# Patient Record
Sex: Female | Born: 1965 | Race: Black or African American | Hispanic: No | Marital: Single | Smoking: Current every day smoker
Health system: Southern US, Community
[De-identification: ages and names within clinical notes are randomized; demographics above are authoritative.]

## PROBLEM LIST (undated history)

## (undated) DIAGNOSIS — R7989 Other specified abnormal findings of blood chemistry: Secondary | ICD-10-CM

## (undated) DIAGNOSIS — N183 Chronic kidney disease, stage 3 unspecified: Secondary | ICD-10-CM

## (undated) DIAGNOSIS — N1330 Unspecified hydronephrosis: Secondary | ICD-10-CM

## (undated) DIAGNOSIS — D219 Benign neoplasm of connective and other soft tissue, unspecified: Secondary | ICD-10-CM

## (undated) DIAGNOSIS — I10 Essential (primary) hypertension: Secondary | ICD-10-CM

## (undated) HISTORY — DX: Essential (primary) hypertension: I10

## (undated) HISTORY — PX: TUBAL LIGATION: SHX77

## (undated) HISTORY — DX: Other specified abnormal findings of blood chemistry: R79.89

## (undated) HISTORY — DX: Unspecified hydronephrosis: N13.30

## (undated) HISTORY — DX: Benign neoplasm of connective and other soft tissue, unspecified: D21.9

---

## 2014-09-19 ENCOUNTER — Ambulatory Visit: Payer: Self-pay

## 2016-04-06 ENCOUNTER — Ambulatory Visit: Payer: Self-pay | Admitting: Family

## 2016-04-06 NOTE — Progress Notes (Signed)
Met with pt. Pt in acute hypertensive crisis with original BP of 184/102. We let her rest for 25mn to see if it would improve and it significantly worsened to 206/112. Pt is here for a physical. I checked her heart/lungs and she is in NAD at this time and CTA BL with RRR, No MRGC noted. Pt understanding that she needs urgent treatment and in agreement to go to the ED.   BP (!) 206/112 (BP Location: Left Arm, Patient Position: Sitting, Cuff Size: Normal)   Pulse 98   Temp 99.5 F (37.5 C) (Oral)   Ht 5' 4"  (1.626 m)   Wt 168 lb 9.6 oz (76.5 kg)   BMI 28.94 kg/m    WBenjamine Mola F[place]2/27/2018 3:26 PM

## 2016-08-23 ENCOUNTER — Ambulatory Visit (INDEPENDENT_AMBULATORY_CARE_PROVIDER_SITE_OTHER): Payer: Self-pay | Admitting: Physician Assistant

## 2016-08-23 ENCOUNTER — Encounter (INDEPENDENT_AMBULATORY_CARE_PROVIDER_SITE_OTHER): Payer: Self-pay | Admitting: Physician Assistant

## 2016-08-23 VITALS — BP 173/96 | HR 86 | Temp 98.9°F | Ht 63.0 in | Wt 164.2 lb

## 2016-08-23 DIAGNOSIS — L83 Acanthosis nigricans: Secondary | ICD-10-CM

## 2016-08-23 DIAGNOSIS — Z23 Encounter for immunization: Secondary | ICD-10-CM

## 2016-08-23 DIAGNOSIS — I10 Essential (primary) hypertension: Secondary | ICD-10-CM

## 2016-08-23 LAB — POCT GLYCOSYLATED HEMOGLOBIN (HGB A1C): Hemoglobin A1C: 5.9

## 2016-08-23 MED ORDER — LISINOPRIL-HYDROCHLOROTHIAZIDE 20-12.5 MG PO TABS: 1.0000 | ORAL_TABLET | Freq: Every day | ORAL | 3 refills | Status: DC

## 2016-08-23 MED ORDER — CLONIDINE HCL 0.1 MG PO TABS
0.2000 mg | ORAL_TABLET | Freq: Once | ORAL | Status: AC
  Administered 2016-08-23: 0.2 mg via ORAL

## 2016-08-23 NOTE — Progress Notes (Addendum)
Subjective:  Patient ID: Deborah Miles, female    DOB: 03-23-1965  Age: 51 y.o. MRN: [id number]  CC: HTN  HPI Deborah Miles is a 51 y.o. female with a PMH of HTN presents to establish care for her HTN. Has not taken medication for several months. Used to take Lisinopril-HCTZ with good effect to normotensive range. Has noted some abdominal bloating and "bowels not moving like they are supposed to". Takes miralax to improve bowel transit. No symptoms except mild left sided occipital headache that occurred two days ago and lasted approximately "35 minutes or more and then just faded on out". Does not endorse CP, palpitations, SOB, f/c/n/v, abdominal pain, rash, LE edema, or urinary sxs.      ROS Review of Systems  Constitutional: Negative for chills, fever and malaise/fatigue.  Eyes: Negative for blurred vision.  Respiratory: Negative for shortness of breath.   Cardiovascular: Negative for chest pain and palpitations.  Gastrointestinal: Negative for abdominal pain and nausea.  Genitourinary: Negative for dysuria and hematuria.  Musculoskeletal: Negative for joint pain and myalgias.  Skin: Negative for rash.  Neurological: Negative for tingling and headaches.  Psychiatric/Behavioral: Negative for depression. The patient is not nervous/anxious.     Objective:  BP (!) 201/126 (BP Location: Left Arm, Patient Position: Sitting, Cuff Size: Normal)   Pulse 86   Temp 98.9 F (37.2 C) (Oral)   Ht 5\' 3"  (1.6 m)   Wt 164 lb 3.2 oz (74.5 kg)   LMP 08/10/2016 (Approximate)   SpO2 99%   BMI 29.09 kg/m   BP/Weight 08/23/2016 03/30/2540  Systolic BP 706 237  Diastolic BP 628 315  Wt. (Lbs) 164.2 168.6  BMI 29.09 28.94      Physical Exam  Constitutional: She is oriented to person, place, and time.  Well developed, well nourished, NAD, polite  HENT:  Head: Normocephalic and atraumatic.  No oral thrush  Eyes: Conjunctivae are normal. No scleral icterus.  Neck: Normal range  of motion. Neck supple. Thyromegaly (mild assymetry of the thryoid R side > L side) present.  Cardiovascular: Normal rate, regular rhythm and normal heart sounds.   Pulmonary/Chest: Effort normal and breath sounds normal.  Abdominal: There is no tenderness.  Musculoskeletal: She exhibits no edema.  Neurological: She is alert and oriented to person, place, and time.  Skin: Skin is warm and dry. No rash noted. No erythema. No pallor.  Hyperpigmented skin on back of neck.  Psychiatric: She has a normal mood and affect. Her behavior is normal. Thought content normal.  Vitals reviewed.    Assessment & Plan:   1. Hypertension, unspecified type - Administered cloNIDine (CATAPRES) tablet 0.2 mg; Take 2 tablets (0.2 mg total) by mouth once. - CBC with Differential - Comprehensive metabolic panel - TSH - Begin lisinopril-hydrochlorothiazide (ZESTORETIC) 20-12.5 MG tablet; Take 1 tablet by mouth daily.  Dispense: 60 tablet; Refill: 3  2. Need for Tdap vaccination - Tdap vaccine greater than or equal to 7yo IM  3. Acanthosis nigricans - HgB A1c 5/9% in clinic today.   Meds ordered this encounter  Medications  . cloNIDine (CATAPRES) tablet 0.2 mg  . lisinopril-hydrochlorothiazide (ZESTORETIC) 20-12.5 MG tablet    Sig: Take 1 tablet by mouth daily.    Dispense:  60 tablet    Refill:  3    Order Specific Question:   Supervising Provider    Answer:   Tresa Garter [id number]    Follow-up: Return in about 2 weeks (around 09/06/2016) for HTN.  [name] PA

## 2016-08-23 NOTE — Patient Instructions (Signed)

## 2016-08-24 LAB — CBC WITH DIFFERENTIAL/PLATELET
BASOS ABS: 0.1 10*3/uL (ref 0.0–0.2)
Basos: 1 %
EOS (ABSOLUTE): 0.4 10*3/uL (ref 0.0–0.4)
Eos: 3 %
Hematocrit: 29.5 % — ABNORMAL LOW (ref 34.0–46.6)
Hemoglobin: 8.8 g/dL — ABNORMAL LOW (ref 11.1–15.9)
IMMATURE GRANULOCYTES: 0 %
Immature Grans (Abs): 0 10*3/uL (ref 0.0–0.1)
LYMPHS ABS: 3.2 10*3/uL — AB (ref 0.7–3.1)
Lymphs: 25 %
MCH: 19.2 pg — AB (ref 26.6–33.0)
MCHC: 29.8 g/dL — AB (ref 31.5–35.7)
MCV: 64 fL — ABNORMAL LOW (ref 79–97)
Monocytes Absolute: 0.8 10*3/uL (ref 0.1–0.9)
Monocytes: 6 %
Neutrophils Absolute: 8.5 10*3/uL — ABNORMAL HIGH (ref 1.4–7.0)
Neutrophils: 65 %
PLATELETS: 430 10*3/uL — AB (ref 150–379)
RBC: 4.58 x10E6/uL (ref 3.77–5.28)
RDW: 23.6 % — AB (ref 12.3–15.4)
WBC: 13 10*3/uL — AB (ref 3.4–10.8)

## 2016-08-24 LAB — COMPREHENSIVE METABOLIC PANEL
ALK PHOS: 126 IU/L — AB (ref 39–117)
ALT: 12 IU/L (ref 0–32)
AST: 15 IU/L (ref 0–40)
Albumin/Globulin Ratio: 1.5 (ref 1.2–2.2)
Albumin: 4.5 g/dL (ref 3.5–5.5)
BUN / CREAT RATIO: 15 (ref 9–23)
BUN: 28 mg/dL — AB (ref 6–24)
CHLORIDE: 93 mmol/L — AB (ref 96–106)
CO2: 21 mmol/L (ref 20–29)
Calcium: 9.4 mg/dL (ref 8.7–10.2)
Creatinine, Ser: 1.9 mg/dL — ABNORMAL HIGH (ref 0.57–1.00)
GFR calc Af Amer: 35 mL/min/{1.73_m2} — ABNORMAL LOW (ref >59)
GFR calc non Af Amer: 31 mL/min/{1.73_m2} — ABNORMAL LOW (ref >59)
GLUCOSE: 71 mg/dL (ref 65–99)
Globulin, Total: 3.1 g/dL (ref 1.5–4.5)
Potassium: 3.6 mmol/L (ref 3.5–5.2)
Sodium: 134 mmol/L (ref 134–144)
Total Protein: 7.6 g/dL (ref 6.0–8.5)

## 2016-08-24 LAB — TSH: TSH: 1.06 u[IU]/mL (ref 0.450–4.500)

## 2016-08-26 ENCOUNTER — Ambulatory Visit (INDEPENDENT_AMBULATORY_CARE_PROVIDER_SITE_OTHER): Payer: Self-pay | Admitting: Physician Assistant

## 2016-08-26 ENCOUNTER — Encounter (INDEPENDENT_AMBULATORY_CARE_PROVIDER_SITE_OTHER): Payer: Self-pay | Admitting: Physician Assistant

## 2016-08-26 VITALS — BP 161/110 | HR 93 | Temp 99.3°F | Wt 162.0 lb

## 2016-08-26 DIAGNOSIS — R7989 Other specified abnormal findings of blood chemistry: Secondary | ICD-10-CM

## 2016-08-26 DIAGNOSIS — I10 Essential (primary) hypertension: Secondary | ICD-10-CM

## 2016-08-26 DIAGNOSIS — D649 Anemia, unspecified: Secondary | ICD-10-CM

## 2016-08-26 LAB — POCT URINALYSIS DIPSTICK
Bilirubin, UA: NEGATIVE
NITRITE UA: NEGATIVE
PH UA: 5.5 (ref 5.0–8.0)
Protein, UA: 100
RBC UA: NEGATIVE
UROBILINOGEN UA: 0.2 U/dL

## 2016-08-26 MED ORDER — CLONIDINE HCL 0.1 MG PO TABS: 0.1000 mg | ORAL_TABLET | Freq: Two times a day (BID) | ORAL | 11 refills | Status: DC

## 2016-08-26 MED ORDER — CLONIDINE HCL 0.1 MG PO TABS
0.1000 mg | ORAL_TABLET | Freq: Once | ORAL | Status: AC
  Administered 2016-08-26: 0.1 mg via ORAL

## 2016-08-26 MED ORDER — AMLODIPINE BESYLATE 10 MG PO TABS: 10.0000 mg | ORAL_TABLET | Freq: Every day | ORAL | 1 refills | Status: DC

## 2016-08-26 NOTE — Patient Instructions (Signed)

## 2016-08-26 NOTE — Progress Notes (Signed)
Subjective:  Patient ID: Deborah Miles, female    DOB: 07-04-1965  Age: 51 y.o. MRN: [id number]  CC: additional labs  HPI Deborah Miles is a 51 y.o. female with a PMH of HTN presents to have additional labs drawn due to abnormal results from labs drawn three days ago. She was found to have Hgb 8.8 g/dL, several RBC indices abnormal, WBC 13K, Plts 430, serum creatinine 1.90, GRF 35. TSH normal. She was advised to stop Lisinopril-HCTZ and to report to clinic ASAP. Patient feels well and does not have any symptoms. Blood pressure has been rising since stopping anti-hypertensives.      Upon questioning, patient divulges that daughter has an autoimmune disease. However, pt has no hx of autoimmune dz.     Outpatient Medications Prior to Visit  Medication Sig Dispense Refill  . lisinopril-hydrochlorothiazide (ZESTORETIC) 20-12.5 MG tablet Take 1 tablet by mouth daily. (Patient not taking: Reported on 08/26/2016) 60 tablet 3   No facility-administered medications prior to visit.      ROS Review of Systems  Constitutional: Negative for chills, fever and malaise/fatigue.  Eyes: Negative for blurred vision.  Respiratory: Negative for shortness of breath.   Cardiovascular: Negative for chest pain and palpitations.  Gastrointestinal: Negative for abdominal pain and nausea.  Genitourinary: Negative for dysuria and hematuria.  Musculoskeletal: Negative for joint pain and myalgias.  Skin: Negative for rash.  Neurological: Negative for tingling and headaches.  Psychiatric/Behavioral: Negative for depression. The patient is not nervous/anxious.     Objective:  BP (!) 191/94 (BP Location: Left Arm, Patient Position: Sitting, Cuff Size: Normal)   Pulse 93   Temp 99.3 F (37.4 C) (Oral)   Wt 162 lb (73.5 kg)   LMP 08/10/2016 (Approximate)   SpO2 100%   BMI 28.70 kg/m   BP/Weight 08/26/2016 08/23/2016 2/53/6644  Systolic BP 034 742 595  Diastolic BP 94 96 638  Wt. (Lbs) 162  164.2 168.6  BMI 28.7 29.09 28.94      Physical Exam  Constitutional: She is oriented to person, place, and time.  Well developed, well nourished, NAD, polite  Eyes: No scleral icterus.  Cardiovascular: Normal rate, regular rhythm and normal heart sounds.   Pulmonary/Chest: Effort normal and breath sounds normal.  Neurological: She is alert and oriented to person, place, and time.  Psychiatric: She has a normal mood and affect. Her behavior is normal. Thought content normal.  Vitals reviewed.    Assessment & Plan:   1. Elevated serum creatinine - US Renal; Future - Comprehensive metabolic panel - ANA w/Reflex - Urinalysis Dipstick  2. Hypertension, unspecified type - Begin amLODipine (NORVASC) 10 MG tablet; Take 1 tablet (10 mg total) by mouth daily.  Dispense: 90 tablet; Refill: 1 - Administered cloNIDine (CATAPRES) tablet 0.1 mg; Take 1 tablet (0.1 mg total) by mouth once. - Begin cloNIDine (CATAPRES) 0.1 MG tablet; Take 1 tablet (0.1 mg total) by mouth 2 (two) times daily.  Dispense: 60 tablet; Refill: 11  3. Anemia, unspecified type - Iron, TIBC and Ferritin Panel - Hemoglobinopathy evaluation   Meds ordered this encounter  Medications  . amLODipine (NORVASC) 10 MG tablet    Sig: Take 1 tablet (10 mg total) by mouth daily.    Dispense:  90 tablet    Refill:  1    Order Specific Question:   Supervising Provider    Answer:   Tresa Garter [id number]  . cloNIDine (CATAPRES) tablet 0.1 mg  . cloNIDine (CATAPRES) 0.1 MG  tablet    Sig: Take 1 tablet (0.1 mg total) by mouth 2 (two) times daily.    Dispense:  60 tablet    Refill:  11    Order Specific Question:   Supervising Provider    Answer:   Tresa Garter [[id number]]    Follow-up: Return in about 2 weeks (around 09/09/2016) for HTN.   [name] PA

## 2016-08-27 LAB — HEMOGLOBINOPATHY EVALUATION: Ferritin: 6 ng/mL — ABNORMAL LOW (ref 15–150)

## 2016-08-27 LAB — COMPREHENSIVE METABOLIC PANEL WITH GFR
ALT: 14 IU/L (ref 0–32)
AST: 15 IU/L (ref 0–40)
Albumin/Globulin Ratio: 1.3 (ref 1.2–2.2)
Albumin: 4.2 g/dL (ref 3.5–5.5)
Alkaline Phosphatase: 122 IU/L — ABNORMAL HIGH (ref 39–117)
BUN/Creatinine Ratio: 13 (ref 9–23)
BUN: 29 mg/dL — ABNORMAL HIGH (ref 6–24)
Bilirubin Total: <0.2 mg/dL (ref 0.0–1.2)
CO2: 22 mmol/L (ref 20–29)
Calcium: 9.5 mg/dL (ref 8.7–10.2)
Chloride: 99 mmol/L (ref 96–106)
Creatinine, Ser: 2.16 mg/dL — ABNORMAL HIGH (ref 0.57–1.00)
GFR calc Af Amer: 30 mL/min/1.73 — ABNORMAL LOW (ref >59)
GFR calc non Af Amer: 26 mL/min/1.73 — ABNORMAL LOW (ref >59)
Globulin, Total: 3.3 g/dL (ref 1.5–4.5)
Glucose: 95 mg/dL (ref 65–99)
Potassium: 3.8 mmol/L (ref 3.5–5.2)
Sodium: 137 mmol/L (ref 134–144)
Total Protein: 7.5 g/dL (ref 6.0–8.5)

## 2016-08-27 LAB — ENA+DNA/DS+SJORGEN'S
ENA SM Ab Ser-aCnc: <0.2 AI (ref 0.0–0.9)
ENA SSA (RO) Ab: 2.2 AI — ABNORMAL HIGH (ref 0.0–0.9)
ENA SSB (LA) AB: 0.2 AI (ref 0.0–0.9)
dsDNA Ab: 1 IU/mL (ref 0–9)

## 2016-08-27 LAB — ANA W/REFLEX: Anti Nuclear Antibody(ANA): POSITIVE — AB

## 2016-08-27 LAB — IRON,TIBC AND FERRITIN PANEL
Iron Saturation: 5 % — CL (ref 15–55)
Iron: 21 ug/dL — ABNORMAL LOW (ref 27–159)
Total Iron Binding Capacity: 424 ug/dL (ref 250–450)
UIBC: 403 ug/dL (ref 131–425)

## 2016-08-30 ENCOUNTER — Other Ambulatory Visit (INDEPENDENT_AMBULATORY_CARE_PROVIDER_SITE_OTHER): Payer: Self-pay | Admitting: Physician Assistant

## 2016-08-30 DIAGNOSIS — D509 Iron deficiency anemia, unspecified: Secondary | ICD-10-CM

## 2016-08-30 DIAGNOSIS — R7989 Other specified abnormal findings of blood chemistry: Secondary | ICD-10-CM

## 2016-08-30 LAB — HEMOGLOBINOPATHY EVALUATION
HEMOGLOBIN A2 QUANTITATION: 1.7 % — AB (ref 1.8–3.2)
HEMOGLOBIN F QUANTITATION: 0 % (ref 0.0–2.0)
HGB A: 98.3 % (ref 96.4–98.8)
HGB C: 0 %
HGB S: 0 %
HGB VARIANT: 0 %

## 2016-08-30 MED ORDER — FERROUS SULFATE 325 (65 FE) MG PO TABS: 325.0000 mg | ORAL_TABLET | Freq: Three times a day (TID) | ORAL | 0 refills | Status: DC

## 2016-08-30 MED ORDER — SENNOSIDES-DOCUSATE SODIUM 8.6-50 MG PO TABS: 1.0000 | ORAL_TABLET | Freq: Every evening | ORAL | 0 refills | Status: AC | PRN

## 2016-08-31 ENCOUNTER — Ambulatory Visit (HOSPITAL_COMMUNITY)
Admission: RE | Admit: 2016-08-31 | Discharge: 2016-08-31 | Disposition: A | Discharge status: Home / Self Care | Payer: Self-pay | Source: Ambulatory Visit | Attending: Physician Assistant | Admitting: Physician Assistant

## 2016-08-31 ENCOUNTER — Other Ambulatory Visit (INDEPENDENT_AMBULATORY_CARE_PROVIDER_SITE_OTHER): Payer: Self-pay | Admitting: Physician Assistant

## 2016-08-31 DIAGNOSIS — N1330 Unspecified hydronephrosis: Secondary | ICD-10-CM | POA: Insufficient documentation

## 2016-08-31 DIAGNOSIS — R7989 Other specified abnormal findings of blood chemistry: Secondary | ICD-10-CM

## 2016-08-31 DIAGNOSIS — D259 Leiomyoma of uterus, unspecified: Secondary | ICD-10-CM | POA: Insufficient documentation

## 2016-08-31 DIAGNOSIS — N281 Cyst of kidney, acquired: Secondary | ICD-10-CM | POA: Insufficient documentation

## 2016-09-01 ENCOUNTER — Encounter (INDEPENDENT_AMBULATORY_CARE_PROVIDER_SITE_OTHER): Payer: Self-pay

## 2016-09-01 ENCOUNTER — Other Ambulatory Visit (INDEPENDENT_AMBULATORY_CARE_PROVIDER_SITE_OTHER): Payer: Self-pay | Admitting: Physician Assistant

## 2016-09-01 ENCOUNTER — Other Ambulatory Visit (INDEPENDENT_AMBULATORY_CARE_PROVIDER_SITE_OTHER): Payer: Self-pay

## 2016-09-01 DIAGNOSIS — R768 Other specified abnormal immunological findings in serum: Secondary | ICD-10-CM

## 2016-09-01 DIAGNOSIS — R7989 Other specified abnormal findings of blood chemistry: Secondary | ICD-10-CM

## 2016-09-01 LAB — HEMOGLOBINOPATHY EVALUATION
HGB C: 0 %
HGB F QUANT: 0 % (ref 0.0–2.0)
HGB S: 0 %
HGB SOLUBILITY: NEGATIVE
HGB VARIANT: 0 %
Hgb A2 Quant: 1.6 % — ABNORMAL LOW (ref 1.8–3.2)
Hgb A: 98.4 % (ref 96.4–98.8)

## 2016-09-01 LAB — SPECIMEN STATUS REPORT

## 2016-09-02 ENCOUNTER — Other Ambulatory Visit (INDEPENDENT_AMBULATORY_CARE_PROVIDER_SITE_OTHER): Payer: Self-pay | Admitting: Physician Assistant

## 2016-09-02 DIAGNOSIS — D259 Leiomyoma of uterus, unspecified: Secondary | ICD-10-CM

## 2016-09-02 LAB — BASIC METABOLIC PANEL
BUN/Creatinine Ratio: 10 (ref 9–23)
BUN: 17 mg/dL (ref 6–24)
CHLORIDE: 98 mmol/L (ref 96–106)
CO2: 21 mmol/L (ref 20–29)
CREATININE: 1.71 mg/dL — AB (ref 0.57–1.00)
Calcium: 9.5 mg/dL (ref 8.7–10.2)
GFR calc Af Amer: 40 mL/min/{1.73_m2} — ABNORMAL LOW (ref >59)
GFR calc non Af Amer: 35 mL/min/{1.73_m2} — ABNORMAL LOW (ref >59)
GLUCOSE: 149 mg/dL — AB (ref 65–99)
Potassium: 3.9 mmol/L (ref 3.5–5.2)
SODIUM: 139 mmol/L (ref 134–144)

## 2016-09-02 LAB — MICROALBUMIN / CREATININE URINE RATIO
Creatinine, Urine: 76 mg/dL
Microalb/Creat Ratio: 1830.4 mg/g creat — ABNORMAL HIGH (ref 0.0–30.0)
Microalbumin, Urine: 1391.1 ug/mL

## 2016-09-07 LAB — PATHOLOGIST SMEAR REVIEW
BASOS: 1 %
Basophils Absolute: 0.1 10*3/uL (ref 0.0–0.2)
EOS (ABSOLUTE): 0.4 10*3/uL (ref 0.0–0.4)
Eos: 3 %
Hematocrit: 29.2 % — ABNORMAL LOW (ref 34.0–46.6)
Hemoglobin: 8.6 g/dL — ABNORMAL LOW (ref 11.1–15.9)
IMMATURE GRANS (ABS): 0.1 10*3/uL (ref 0.0–0.1)
Immature Granulocytes: 1 %
LYMPHS: 20 %
Lymphocytes Absolute: 2.4 10*3/uL (ref 0.7–3.1)
MCH: 18.9 pg — ABNORMAL LOW (ref 26.6–33.0)
MCHC: 29.5 g/dL — ABNORMAL LOW (ref 31.5–35.7)
MCV: 64 fL — AB (ref 79–97)
MONOCYTES: 4 %
Monocytes Absolute: 0.4 10*3/uL (ref 0.1–0.9)
NEUTROS ABS: 8.5 10*3/uL — AB (ref 1.4–7.0)
Neutrophils: 71 %
Platelets: 511 10*3/uL — ABNORMAL HIGH (ref 150–379)
RBC: 4.55 x10E6/uL (ref 3.77–5.28)
RDW: 23.8 % — AB (ref 12.3–15.4)
WBC: 11.8 10*3/uL — ABNORMAL HIGH (ref 3.4–10.8)

## 2016-09-08 ENCOUNTER — Encounter (INDEPENDENT_AMBULATORY_CARE_PROVIDER_SITE_OTHER): Payer: Self-pay | Admitting: Physician Assistant

## 2016-09-08 ENCOUNTER — Ambulatory Visit (INDEPENDENT_AMBULATORY_CARE_PROVIDER_SITE_OTHER): Payer: Self-pay | Admitting: Physician Assistant

## 2016-09-08 VITALS — BP 187/103 | HR 86 | Temp 98.0°F | Wt 161.0 lb

## 2016-09-08 DIAGNOSIS — R809 Proteinuria, unspecified: Secondary | ICD-10-CM

## 2016-09-08 DIAGNOSIS — I10 Essential (primary) hypertension: Secondary | ICD-10-CM

## 2016-09-08 DIAGNOSIS — D259 Leiomyoma of uterus, unspecified: Secondary | ICD-10-CM

## 2016-09-08 LAB — POCT URINALYSIS DIPSTICK
Bilirubin, UA: NEGATIVE
Glucose, UA: NEGATIVE
Ketones, UA: NEGATIVE
NITRITE UA: NEGATIVE
PH UA: 5.5 (ref 5.0–8.0)
Spec Grav, UA: <=1.005 — AB (ref 1.010–1.025)
Urobilinogen, UA: 0.2 E.U./dL

## 2016-09-08 MED ORDER — CARVEDILOL 6.25 MG PO TABS: 6.2500 mg | ORAL_TABLET | Freq: Two times a day (BID) | ORAL | 2 refills | Status: DC

## 2016-09-08 NOTE — Progress Notes (Signed)
Subjective:  Patient ID: Deborah Miles, female    DOB: [birth year]  Age: 51 y.o. MRN: [id number]  CC: proteinuria  HPI Amita Atayde Dicostanzo is a 51 y.o. female with a PMH of HTN, proteinuria, elevated serum creatinine, and uterine fibroids returns as requested to f/u on the aforementioned conditions. BP is noted to be elevated this morning but patient reports having only taken her clonidine 0.1mg  dose 15 minutes ago. Says her BP drops to the 956O systolic and 13Y diastolic when the medications are a full effect. Does not endorse  CP, palpitations, SOB, HA, tingling, numbness, weakness, LE swelling, presyncope, or syncope.     Serum creatinine has been elevated since her first test. Last reading 1.71 mg/dL. Patient's spot protein was 1,830.4 mg/g creat. No blood detected on the POCT UA on 08/26/16. Today's POCT UA detects moderate blood and increased proteins. Of note, pt's SSA Ab was positive at 2.2. Renal US also showed mild hydronephrosis likely due to ureter impingement from and enlarged uterine fibroid. Pt received a call from the GYN office but ultimately could not attend due to cost. An urgent rheumatology and nephrology referral have been made but patient has not heard from these offices yet. However, patient does not think she can afford these consults. Patient has not yet filled out orange card and [place] discount applications for financial assistance.  She feels generally well and does not feel as if she were ill. Does not endorse  oliguria/anuria, f/c/n/v, back pain, skin lesions, or jaundice.   Outpatient Medications Prior to Visit  Medication Sig Dispense Refill  . amLODipine (NORVASC) 10 MG tablet Take 1 tablet (10 mg total) by mouth daily. 90 tablet 1  . cloNIDine (CATAPRES) 0.1 MG tablet Take 1 tablet (0.1 mg total) by mouth 2 (two) times daily. 60 tablet 11  . ferrous sulfate 325 (65 FE) MG tablet Take 1 tablet (325 mg total) by mouth 3 (three) times daily with meals. 90  tablet 0  . senna-docusate (SB DOCUSATE SODIUM/SENNA) 8.6-50 MG tablet Take 1 tablet by mouth at bedtime as needed for mild constipation. 30 tablet 0   No facility-administered medications prior to visit.      ROS Review of Systems  Constitutional: Negative for chills, fever and malaise/fatigue.  Eyes: Negative for blurred vision.  Respiratory: Negative for shortness of breath.   Cardiovascular: Negative for chest pain and palpitations.  Gastrointestinal: Negative for abdominal pain and nausea.  Genitourinary: Negative for dysuria and hematuria.  Musculoskeletal: Negative for joint pain and myalgias.  Skin: Negative for rash.  Neurological: Negative for tingling and headaches.  Psychiatric/Behavioral: Negative for depression. The patient is not nervous/anxious.     Objective:  LMP 08/10/2016 (Approximate)   BP/Weight 09/01/2016 08/26/2016 8/65/7846  Systolic BP 962 952 841  Diastolic BP 92 324 96  Wt. (Lbs) 162.8 162 164.2  BMI 28.84 28.7 29.09      Physical Exam  Constitutional: She is oriented to person, place, and time.  Well developed, well nourished, NAD, polite  HENT:  Head: Normocephalic and atraumatic.  Eyes: No scleral icterus.  Neck: Normal range of motion. Neck supple. No thyromegaly present.  Cardiovascular: Normal rate, regular rhythm and normal heart sounds.   No lower extremity edema  Pulmonary/Chest: Effort normal and breath sounds normal.  Abdominal: Soft. Bowel sounds are normal.  Musculoskeletal: She exhibits no edema.  Neurological: She is alert and oriented to person, place, and time. No cranial nerve deficit. Coordination normal.  Skin: Skin  is warm and dry. No rash noted. No erythema. No pallor.  Psychiatric: She has a normal mood and affect. Her behavior is normal. Thought content normal.  Vitals reviewed.    Assessment & Plan:    1. Hypertension, unspecified type - Protein Electrophoresis, Urine Rflx. - Protein electrophoresis, serum -  HIV antibody - Hepatitis panel, acute - Urinalysis Dipstick - Lipid Panel - Antistreptolysin O titer - C-reactive protein - Basic Metabolic Panel - carvedilol (COREG) 6.25 MG tablet; Take 1 tablet (6.25 mg total) by mouth 2 (two) times daily with a meal.  Dispense: 30 tablet; Refill: 2  2. Proteinuria, unspecified type - Protein Electrophoresis, Urine Rflx. - Protein electrophoresis, serum - HIV antibody - Hepatitis panel, acute - Urinalysis Dipstick in clinic today showed first sign of hematuria. - Lipid Panel - Antistreptolysin O titer - C-reactive protein - Basic Metabolic Panel  3. Uterine leiomyoma, unspecified location - Pt could not make appointment with GYN due to lack of funds. I have advised that she get her orange card and [employer] discount applications done ASAP.   Meds ordered this encounter  Medications  . carvedilol (COREG) 6.25 MG tablet    Sig: Take 1 tablet (6.25 mg total) by mouth 2 (two) times daily with a meal.    Dispense:  30 tablet    Refill:  2    Order Specific Question:   Supervising Provider    Answer:   Tresa Garter [id number]    Follow-up: Return in about 4 weeks (around 10/06/2016).   [name] PA

## 2016-09-08 NOTE — Patient Instructions (Signed)
Proteinuria Proteinuria is when there is too much protein in the urine. Proteins are important for buildingmuscles and bones. Proteins are also needed to fight infections, help the blood clot, and keep body fluids in balance. Proteinuria may be mild and temporary, or it may be an early sign of kidney disease. The kidneys make urine. Healthy kidneys also keep substances like proteins from leaving the blood and ending up in the urine. Proteinuria may be a sign that the kidneys are not working well. What are the causes? Healthy kidneys have filters (glomeruli) that keep proteins out of the urine. Proteinuria may mean that the glomeruli are damaged. The main causes of this type of damage are:  Diabetes.  High blood pressure.  Other causes of kidney damage can also cause proteinuria, such as:  Diseases of the immune system, such as lupus, rheumatoid arthritis, sarcoidosis, and Goodpasture syndrome.  Heart disease or heart failure.  Kidney infection.  Certain cancers, including kidney cancer, lymphoma, leukemia, and multiple myeloma.  Amyloidosis. This is a disease that causes abnormal proteins to build up in body tissues.  Reactions to certain medicines, such as NSAIDs.  Injury (trauma) or poisons (toxins).  High blood pressure that occurs during pregnancy (preeclampsia).  Temporary proteinuria may result from conditions that put stress on the kidneys. These conditions usually do not cause kidney damage. They include:  Fever.  Exposure to cold or heat.  Emotional or physical stress.  Extreme exercise.  Standing for long periods of time.  What increases the risk? This condition is more likely to develop in people who:  Have diabetes.  Have high blood pressure.  Have heart disease or heart failure.  Have an immune disease, cancer, or other disease that affects the kidneys.  Have a family history of kidney disease.  Are 65 or older.  Are overweight.  Are of  African-American, American [place], Hispanic/Latino, or [place] descent.  Are pregnant.  Have an infection.  What are the signs or symptoms? Mild proteinuria may not cause symptoms. As more proteins enter the urine, symptoms of kidney disease may develop, such as:  Foamy urine.  Swelling of the face, abdomen, hands, legs, or feet (edema).  Needing to urinate frequently.  Fatigue.  Difficulty sleeping.  Dry and itchy skin.  Nausea and vomiting.  Muscle cramps.  Shortness of breath.  How is this diagnosed? Your health care provider can diagnose proteinuria with a urine test. You may have this test as part of a routine physical or because you have symptoms of kidney disease or risk factors for kidney disease. You may also have:  Blood tests to measure the level of a certain substance (creatinine) that increases with kidney disease.  Imaging tests of your kidney, such as a CT scan or an ultrasound, to look for signs of kidney damage.  How is this treated? If your proteinuria is mild or temporary, no treatment may be needed. Your health care provider may show you how to monitor the level of protein in your urine at home. Identifying proteinuria early is important so that the cause of the condition can be treated. Treatment depends on the cause of your proteinuria. Treatment may include:  Making diet and lifestyle changes.  Getting blood pressure under control.  Getting blood sugar under control, if you have diabetes.  Managing any other medical conditions you have that affect your kidneys.  Giving birth, if you are pregnant.  Avoiding medicines that damage your kidneys.  Treating kidney disease with medicine  and dialysis, as needed.  Follow these instructions at home:  Check your protein levels at home, if directed by your health care provider.  Follow instructions from your health care provider about eating or drinking restrictions.  Take over-the-counter  and prescription medicines only as told by your health care provider.  Return to your normal activities as told by your health care provider. Ask your health care provider what activities are safe for you.  If you are overweight, ask your health care provider to help you with a diet to get to a healthy weight.  Ask your health care provider to help you with an exercise program.  Keep all follow-up visits as told by your health care provider. This is important. Contact a health care provider if:  You have new symptoms.  Your symptoms get worse or do not improve. Get help right away if:  You have back pain.  You have diarrhea.  You vomit.  You have a fever.  You have a rash. This information is not intended to replace advice given to you by your health care provider. Make sure you discuss any questions you have with your health care provider. Document Released: 03/17/2005 Document Revised: 03/07/2015 Document Reviewed: 12/16/2014 Elsevier Interactive Patient Education  2018 Elsevier Inc.  

## 2016-09-10 ENCOUNTER — Ambulatory Visit (INDEPENDENT_AMBULATORY_CARE_PROVIDER_SITE_OTHER): Payer: Self-pay | Admitting: Physician Assistant

## 2016-09-10 LAB — HEPATITIS PANEL, ACUTE
HEP B C IGM: NEGATIVE
Hep A IgM: NEGATIVE
Hep C Virus Ab: <0.1 s/co ratio (ref 0.0–0.9)
Hepatitis B Surface Ag: NEGATIVE

## 2016-09-10 LAB — HIV ANTIBODY (ROUTINE TESTING W REFLEX): HIV SCREEN 4TH GENERATION: NONREACTIVE

## 2016-09-10 LAB — C-REACTIVE PROTEIN: CRP: 5.4 mg/L — ABNORMAL HIGH (ref 0.0–4.9)

## 2016-09-10 LAB — BASIC METABOLIC PANEL
BUN/Creatinine Ratio: 9 (ref 9–23)
BUN: 19 mg/dL (ref 6–24)
CALCIUM: 9.6 mg/dL (ref 8.7–10.2)
CO2: 20 mmol/L (ref 20–29)
Chloride: 103 mmol/L (ref 96–106)
Creatinine, Ser: 2.17 mg/dL — ABNORMAL HIGH (ref 0.57–1.00)
GFR, EST AFRICAN AMERICAN: 30 mL/min/{1.73_m2} — AB (ref >59)
GFR, EST NON AFRICAN AMERICAN: 26 mL/min/{1.73_m2} — AB (ref >59)
Glucose: 115 mg/dL — ABNORMAL HIGH (ref 65–99)
POTASSIUM: 4.1 mmol/L (ref 3.5–5.2)
Sodium: 138 mmol/L (ref 134–144)

## 2016-09-10 LAB — PROTEIN ELECTROPHORESIS, URINE REFLEX
ALPHA-2-GLOBULIN, U: 5.4 %
Albumin ELP, Urine: 72.4 %
Alpha-1-Globulin, U: 4.2 %
BETA GLOBULIN, U: 8.6 %
Gamma Globulin, U: 9.3 %
Protein, Ur: 110.5 mg/dL

## 2016-09-10 LAB — PROTEIN ELECTROPHORESIS, SERUM
A/G RATIO SPE: 0.9 (ref 0.7–1.7)
Albumin ELP: 3.5 g/dL (ref 2.9–4.4)
Alpha 1: 0.2 g/dL (ref 0.0–0.4)
Alpha 2: 0.7 g/dL (ref 0.4–1.0)
BETA: 1.3 g/dL (ref 0.7–1.3)
GAMMA GLOBULIN: 1.5 g/dL (ref 0.4–1.8)
GLOBULIN, TOTAL: 3.7 g/dL (ref 2.2–3.9)
TOTAL PROTEIN: 7.2 g/dL (ref 6.0–8.5)

## 2016-09-10 LAB — LIPID PANEL
CHOL/HDL RATIO: 4.6 ratio — AB (ref 0.0–4.4)
Cholesterol, Total: 260 mg/dL — ABNORMAL HIGH (ref 100–199)
HDL: 56 mg/dL (ref >39)
LDL CALC: 167 mg/dL — AB (ref 0–99)
TRIGLYCERIDES: 183 mg/dL — AB (ref 0–149)
VLDL CHOLESTEROL CAL: 37 mg/dL (ref 5–40)

## 2016-09-10 LAB — ANTISTREPTOLYSIN O TITER: ASO: 37 IU/mL (ref 0.0–200.0)

## 2016-09-15 ENCOUNTER — Telehealth (INDEPENDENT_AMBULATORY_CARE_PROVIDER_SITE_OTHER): Payer: Self-pay | Admitting: Physician Assistant

## 2016-09-16 NOTE — Telephone Encounter (Signed)
Spoke to patient

## 2016-09-30 ENCOUNTER — Ambulatory Visit: Payer: Self-pay | Admitting: Obstetrics & Gynecology

## 2016-10-06 ENCOUNTER — Encounter: Payer: Self-pay | Admitting: Obstetrics and Gynecology

## 2016-10-06 ENCOUNTER — Ambulatory Visit (INDEPENDENT_AMBULATORY_CARE_PROVIDER_SITE_OTHER): Payer: Self-pay | Admitting: Obstetrics and Gynecology

## 2016-10-06 VITALS — BP 200/116 | HR 82 | Ht 63.5 in | Wt 160.5 lb

## 2016-10-06 DIAGNOSIS — R7989 Other specified abnormal findings of blood chemistry: Secondary | ICD-10-CM

## 2016-10-06 DIAGNOSIS — N1330 Unspecified hydronephrosis: Secondary | ICD-10-CM

## 2016-10-06 DIAGNOSIS — D259 Leiomyoma of uterus, unspecified: Secondary | ICD-10-CM

## 2016-10-06 DIAGNOSIS — I10 Essential (primary) hypertension: Secondary | ICD-10-CM

## 2016-10-06 NOTE — Progress Notes (Signed)
BP elevated, rechecked manually. Patient denies headache or visual disturbances. Reported to Dr. Ilda Basset, continue with checking patient in.

## 2016-10-06 NOTE — Progress Notes (Addendum)
Error

## 2016-10-08 ENCOUNTER — Encounter: Payer: Self-pay | Admitting: Obstetrics and Gynecology

## 2016-10-08 DIAGNOSIS — R7989 Other specified abnormal findings of blood chemistry: Secondary | ICD-10-CM | POA: Insufficient documentation

## 2016-10-08 DIAGNOSIS — I10 Essential (primary) hypertension: Secondary | ICD-10-CM | POA: Insufficient documentation

## 2016-10-08 DIAGNOSIS — N1330 Unspecified hydronephrosis: Secondary | ICD-10-CM | POA: Insufficient documentation

## 2016-10-12 ENCOUNTER — Telehealth (INDEPENDENT_AMBULATORY_CARE_PROVIDER_SITE_OTHER): Payer: Self-pay | Admitting: Physician Assistant

## 2016-10-12 NOTE — Telephone Encounter (Signed)
Pt called with the right milligrams of her Lotrel  10/40 sorry for the inconvenience

## 2016-10-12 NOTE — Telephone Encounter (Signed)
Fwd to PCP. Tempestt S Roberts, CMA  

## 2016-10-12 NOTE — Telephone Encounter (Signed)
Pt needs to be seen in clinic. I need to assess her BP with her new medication, carvedilol.

## 2016-10-12 NOTE — Telephone Encounter (Signed)
FWD to PCP. Tempestt S Roberts, CMA  

## 2016-10-12 NOTE — Telephone Encounter (Signed)
Patient was seen in August but Forgot the name of her BP medicine  And the name is Lotrel 20.5 mg patient use [facility] at [facility] please, call her  Deborah Miles you

## 2016-10-12 NOTE — Telephone Encounter (Signed)
Please refer to other note in regards to Lotrel.

## 2016-10-15 NOTE — Telephone Encounter (Signed)
Pt has an appt  10/20/16 @ 1:45PM

## 2016-10-15 NOTE — Telephone Encounter (Signed)
Please schedule patient for a BP recheck per the provider.

## 2016-10-20 ENCOUNTER — Ambulatory Visit (INDEPENDENT_AMBULATORY_CARE_PROVIDER_SITE_OTHER): Payer: Self-pay | Admitting: Physician Assistant

## 2016-10-27 ENCOUNTER — Other Ambulatory Visit (INDEPENDENT_AMBULATORY_CARE_PROVIDER_SITE_OTHER): Payer: Self-pay | Admitting: Physician Assistant

## 2016-10-27 ENCOUNTER — Ambulatory Visit (INDEPENDENT_AMBULATORY_CARE_PROVIDER_SITE_OTHER): Payer: Self-pay | Admitting: Physician Assistant

## 2016-10-27 ENCOUNTER — Encounter (INDEPENDENT_AMBULATORY_CARE_PROVIDER_SITE_OTHER): Payer: Self-pay | Admitting: Physician Assistant

## 2016-10-27 VITALS — BP 202/113 | HR 77 | Temp 98.6°F | Wt 159.4 lb

## 2016-10-27 DIAGNOSIS — R809 Proteinuria, unspecified: Secondary | ICD-10-CM

## 2016-10-27 DIAGNOSIS — I10 Essential (primary) hypertension: Secondary | ICD-10-CM

## 2016-10-27 DIAGNOSIS — R7989 Other specified abnormal findings of blood chemistry: Secondary | ICD-10-CM

## 2016-10-27 DIAGNOSIS — D259 Leiomyoma of uterus, unspecified: Secondary | ICD-10-CM

## 2016-10-27 MED ORDER — CLONIDINE HCL 0.2 MG PO TABS: 0.2000 mg | ORAL_TABLET | Freq: Three times a day (TID) | ORAL | 11 refills | Status: DC

## 2016-10-27 MED ORDER — CARVEDILOL 6.25 MG PO TABS: 6.2500 mg | ORAL_TABLET | Freq: Two times a day (BID) | ORAL | 3 refills | Status: DC

## 2016-10-27 MED ORDER — AMLODIPINE BESYLATE 10 MG PO TABS: 10.0000 mg | ORAL_TABLET | Freq: Every day | ORAL | 3 refills | Status: DC

## 2016-10-27 NOTE — Patient Instructions (Signed)
Systemic Lupus Erythematosus, Adult Systemic lupus erythematosus is a long-term (chronic) disease that can affect many parts of the body. It can damage the skin, joints, blood vessels, brain, kidneys, lungs, heart, and other internal organs. It causes pain, irritation, and inflammation. Systemic lupus erythematosus is an autoimmune disease. With this type of disease, the body's defense system (immune system) mistakenly attacks normal tissues instead of attacking germs or abnormal growths. What are the causes? The cause of this condition is not known. What increases the risk? This condition is more likely to develop in:  Females.  People of Asian descent.  People of African-American descent.  People who have a family history of the condition.  What are the signs or symptoms? General symptoms include:  Joint pain and swelling (common).  Fever.  Fatigue.  Unusual weight loss or weight gain.  Skin rashes, especially over the nose and cheeks (butterfly rash) and after sun exposure.  Sores inside the mouth or nose.  Other symptoms depend on which parts of the body are affected. They can include:  Shortness of breath.  Chest pain.  Frequent urination.  Blood in the urine.  Seizures.  Mental changes.  Hair loss.  Swollen and tender lymph nodes.  Swelling of the hands or feet.  Symptoms can come and go. A period of time when symptoms get worse or come back is called a flare. A period of time with no symptoms is called a remission. How is this diagnosed? This condition is diagnosed based on symptoms, a medical history, and a physical exam. You may also have tests, including:  Blood tests.  Urine tests.  A chest X-ray.  A skin or kidney biopsy. For this test, a sample of tissue is taken from the skin or kidney and studied under a microscope.  You may be referred to an autoimmune disease specialist (rheumatologist). How is this treated? There is no cure for this  condition, but treatment can keep the disease in remission, help to control symptoms, and prevent damage to the heart, lungs, kidneys, and other organs. Treatment may involve taking a combination of medicines over time. Follow these instructions at home: Medicines  Take medicines only as directed by your health care provider.  Do not take any medicines that contain estrogen without first checking with your health care provider. Estrogen can trigger flares and may increase your risk for blood clots. Lifestyle  Eat a heart-healthy diet.  Stay active as directed by your health care provider.  Do not smoke. If you need help quitting, ask your health care provider.  Protect your skin from the sun by applying sunblock and wearing protective hats and clothing.  Learn as much as you can about your condition and have a good support system in place. Support may come from family, friends, or a lupus support group. General instructions  Keep all follow-up visits as directed by your health care provider. This is important.  Work closely with all of your health care providers to manage your condition.  Let your health care provider know right away if you become pregnant or if you plan to become pregnant. Pregnancy in women with this condition is considered high risk. Contact a health care provider if:  You have a fever.  Your symptoms flare.  You develop new symptoms.  You develop swollen feet or hands.  You develop puffiness around your eyes.  Your medicines are not working.  You have bloody, foamy, or coffee-colored urine.  There are changes in your   urination. For example, you urinate more often at night.  You think that you may be depressed or have anxiety. Get help right away if:  You have chest pain.  You have trouble breathing.  You have a seizure.  You suddenly get a very bad headache.  You suddenly develop facial or body weakness.  You cannot speak.  You cannot  understand speech. This information is not intended to replace advice given to you by your health care provider. Make sure you discuss any questions you have with your health care provider. Document Released: 01/15/2002 Document Revised: 09/21/2015 Document Reviewed: 01/02/2014 Elsevier Interactive Patient Education  2018 Elsevier Inc.  

## 2016-10-27 NOTE — Progress Notes (Signed)
Subjective:  Patient ID: Deborah Miles, female    DOB: 01-20-1966  Age: 51 y.o. MRN: [id number]  CC: HTN and medications  HPI  Deborah Miles is a 51 y.o. female with a PMH of HTN, proteinuria, elevated serum creatinine, and uterine fibroids returns to f/u on HTN, abnormal ANA/dsDNA, and elevated serum creatinine. HTN is uncontrolled. Has taken clonidine 0.1 mg this morning. Has not taken amlodipine yet and has run out of carvedilol. Requests she be put back on Lotrel. Shares today that her previous provider mentioned she had a mildly elevated serum creatinine but did not avoid prescribing her Lotrel. She reports feeling well and is without swelling, joint pains, malaise, fatigue, urinary symptoms, back pain, headache, CP, palpitations, SOB, abdominal pain, f/c/n/v, or rash. Patient was denied by rheumatology as they wanted her to see nephrology first. Nephrology has not contacted patient yet. She was able to go to GYN and was told the fibroid is likely not causing enough of an impingement on the ureters to be causing hydronephrosis. Patient has applied to the [facility] and [employer] but has not heard any decision from them yet.     Outpatient Medications Prior to Visit  Medication Sig Dispense Refill  . amLODipine (NORVASC) 10 MG tablet Take 1 tablet (10 mg total) by mouth daily. 90 tablet 1  . Ascorbic Acid (VITAMIN C) 1000 MG tablet Take 1,000 mg by mouth daily.    . cloNIDine (CATAPRES) 0.1 MG tablet Take 1 tablet (0.1 mg total) by mouth 2 (two) times daily. 60 tablet 11  . Multiple Vitamin (MULTIVITAMIN) tablet Take 1 tablet by mouth daily.    Marland Kitchen senna-docusate (SB DOCUSATE SODIUM/SENNA) 8.6-50 MG tablet Take 1 tablet by mouth at bedtime as needed for mild constipation. 30 tablet 0  . carvedilol (COREG) 6.25 MG tablet Take 1 tablet (6.25 mg total) by mouth 2 (two) times daily with a meal. (Patient not taking: Reported on 10/27/2016) 30 tablet 2  .  ferrous sulfate 325 (65 FE) MG tablet Take 1 tablet (325 mg total) by mouth 3 (three) times daily with meals. (Patient not taking: Reported on 10/27/2016) 90 tablet 0   No facility-administered medications prior to visit.      ROS Review of Systems  Constitutional: Negative for chills, fever and malaise/fatigue.  Eyes: Negative for blurred vision.  Respiratory: Negative for shortness of breath.   Cardiovascular: Negative for chest pain and palpitations.  Gastrointestinal: Negative for abdominal pain and nausea.  Genitourinary: Negative for dysuria and hematuria.  Musculoskeletal: Negative for joint pain and myalgias.  Skin: Negative for rash.  Neurological: Negative for tingling and headaches.  Psychiatric/Behavioral: Negative for depression. The patient is not nervous/anxious.     Objective:  BP (!) 202/113 (BP Location: Right Arm, Patient Position: Sitting, Cuff Size: Large)   Pulse 77   Temp 98.6 F (37 C) (Oral)   Wt 159 lb 6.4 oz (72.3 kg)   LMP 10/13/2016 (Approximate)   SpO2 100%   BMI 27.79 kg/m   BP/Weight 10/27/2016 3/41/9379 0/03/4095  Systolic BP 353 299 242  Diastolic BP 683 419 622  Wt. (Lbs) 159.4 160.5 161  BMI 27.79 27.99 28.52      Physical Exam  Constitutional: She is oriented to person, place, and time.  Well developed, well nourished, NAD, polite, well versed  HENT:  Head: Normocephalic and atraumatic.  Eyes: Conjunctivae are normal. No scleral icterus.  Neck: Normal range of motion. Neck supple. No thyromegaly  present.  Cardiovascular: Normal rate, regular rhythm and normal heart sounds.   Pulmonary/Chest: Breath sounds normal. No respiratory distress.  Musculoskeletal: She exhibits no edema.  Neurological: She is alert and oriented to person, place, and time. No cranial nerve deficit. Coordination normal.  Skin: Skin is warm and dry. No rash noted. No erythema. No pallor.  Psychiatric: She has a normal mood and affect. Her behavior is normal.  Thought content normal.  Vitals reviewed.    Assessment & Plan:    1. Hypertension, unspecified type - Refill carvedilol (COREG) 6.25 MG tablet; Take 1 tablet (6.25 mg total) by mouth 2 (two) times daily with a meal.  Dispense: 90 tablet; Refill: 3 - Increase cloNIDine (CATAPRES) 0.2 MG tablet; Take 1 tablet (0.2 mg total) by mouth 3 (three) times daily.  Dispense: 90 tablet; Refill: 11 - Refill amLODipine (NORVASC) 10 MG tablet; Take 1 tablet (10 mg total) by mouth daily.  Dispense: 90 tablet; Refill: 3 - VAS US RENAL ARTERY DUPLEX; Future  2. Proteinuria, unspecified type - We have looked up referral notes and referral was closed with no comments as to why the referral was closed. CMA left a message to Rem[facility] to call back with information about the referral closure.   3. Uterine leiomyoma, unspecified location - Followed by GYN. Not likely causing hydronephrosis 2/2 ureter impingement.   4. Elevated serum creatinine - Basic Metabolic Panel   Meds ordered this encounter  Medications  . carvedilol (COREG) 6.25 MG tablet    Sig: Take 1 tablet (6.25 mg total) by mouth 2 (two) times daily with a meal.    Dispense:  90 tablet    Refill:  3    Order Specific Question:   Supervising Provider    Answer:   Tresa Garter [id number]  . cloNIDine (CATAPRES) 0.2 MG tablet    Sig: Take 1 tablet (0.2 mg total) by mouth 3 (three) times daily.    Dispense:  90 tablet    Refill:  11    Order Specific Question:   Supervising Provider    Answer:   Tresa Garter [id number]  . amLODipine (NORVASC) 10 MG tablet    Sig: Take 1 tablet (10 mg total) by mouth daily.    Dispense:  90 tablet    Refill:  3    Order Specific Question:   Supervising Provider    Answer:   Tresa Garter [id number]    Follow-up: Return in about 4 weeks (around 11/24/2016).   [name] PA

## 2016-10-29 LAB — BASIC METABOLIC PANEL
BUN / CREAT RATIO: 8 — AB (ref 9–23)
BUN: 17 mg/dL (ref 6–24)
CHLORIDE: 102 mmol/L (ref 96–106)
CO2: 24 mmol/L (ref 20–29)
CREATININE: 2.12 mg/dL — AB (ref 0.57–1.00)
Calcium: 10.4 mg/dL — ABNORMAL HIGH (ref 8.7–10.2)
GFR calc non Af Amer: 27 mL/min/{1.73_m2} — ABNORMAL LOW (ref >59)
GFR, EST AFRICAN AMERICAN: 31 mL/min/{1.73_m2} — AB (ref >59)
Glucose: 103 mg/dL — ABNORMAL HIGH (ref 65–99)
Potassium: 4.6 mmol/L (ref 3.5–5.2)
Sodium: 138 mmol/L (ref 134–144)

## 2016-11-03 ENCOUNTER — Ambulatory Visit (HOSPITAL_COMMUNITY)
Admission: RE | Admit: 2016-11-03 | Discharge: 2016-11-03 | Disposition: A | Discharge status: Home / Self Care | Payer: Self-pay | Source: Ambulatory Visit | Attending: Physician Assistant | Admitting: Physician Assistant

## 2016-11-03 DIAGNOSIS — I701 Atherosclerosis of renal artery: Secondary | ICD-10-CM | POA: Insufficient documentation

## 2016-11-03 DIAGNOSIS — I10 Essential (primary) hypertension: Secondary | ICD-10-CM | POA: Insufficient documentation

## 2016-11-03 NOTE — Progress Notes (Signed)
*  PRELIMINARY RESULTS* Vascular Ultrasound Renal Artery Duplex has been completed.  Preliminary findings:  No evidence of right renal artery stenosis. Left 1-59% renal artery stenosis.   Landry Mellow, RDMS, RVT  11/03/2016, 11:30 AM

## 2016-11-04 ENCOUNTER — Other Ambulatory Visit (INDEPENDENT_AMBULATORY_CARE_PROVIDER_SITE_OTHER): Payer: Self-pay | Admitting: Physician Assistant

## 2016-11-04 ENCOUNTER — Telehealth (INDEPENDENT_AMBULATORY_CARE_PROVIDER_SITE_OTHER): Payer: Self-pay

## 2016-11-04 DIAGNOSIS — R7989 Other specified abnormal findings of blood chemistry: Secondary | ICD-10-CM

## 2016-11-04 NOTE — Telephone Encounter (Signed)
Patient is aware of [place] results. Nat Christen, CMA

## 2016-11-04 NOTE — Telephone Encounter (Signed)
-----   Message from [name], PA-C sent at 11/03/2016  6:03 PM EDT ----- Mild left sided renal artery stenosis but not enough to be the cause of her elevated BP.

## 2016-11-05 ENCOUNTER — Ambulatory Visit (HOSPITAL_COMMUNITY): Admission: RE | Admit: 2016-11-05 | Payer: Self-pay | Source: Ambulatory Visit

## 2016-11-22 ENCOUNTER — Other Ambulatory Visit (INDEPENDENT_AMBULATORY_CARE_PROVIDER_SITE_OTHER): Payer: Self-pay | Admitting: Physician Assistant

## 2016-11-22 DIAGNOSIS — D509 Iron deficiency anemia, unspecified: Secondary | ICD-10-CM

## 2016-11-24 ENCOUNTER — Encounter (INDEPENDENT_AMBULATORY_CARE_PROVIDER_SITE_OTHER): Payer: Self-pay | Admitting: Physician Assistant

## 2016-11-24 ENCOUNTER — Ambulatory Visit (INDEPENDENT_AMBULATORY_CARE_PROVIDER_SITE_OTHER): Payer: Self-pay | Admitting: Physician Assistant

## 2016-11-24 VITALS — BP 172/99 | HR 69 | Temp 98.0°F | Wt 162.6 lb

## 2016-11-24 DIAGNOSIS — I10 Essential (primary) hypertension: Secondary | ICD-10-CM

## 2016-11-24 DIAGNOSIS — R7989 Other specified abnormal findings of blood chemistry: Secondary | ICD-10-CM

## 2016-11-24 DIAGNOSIS — K5900 Constipation, unspecified: Secondary | ICD-10-CM

## 2016-11-24 MED ORDER — AMLODIPINE BESYLATE 10 MG PO TABS: 10.0000 mg | ORAL_TABLET | Freq: Every day | ORAL | 3 refills | Status: AC

## 2016-11-24 MED ORDER — CARVEDILOL 6.25 MG PO TABS: 6.2500 mg | ORAL_TABLET | Freq: Two times a day (BID) | ORAL | 3 refills | Status: DC

## 2016-11-24 MED ORDER — CLONIDINE HCL 0.2 MG PO TABS: 0.2000 mg | ORAL_TABLET | Freq: Three times a day (TID) | ORAL | 11 refills | Status: DC

## 2016-11-24 MED ORDER — ISOSORBIDE MONONITRATE ER 30 MG PO TB24: 30.0000 mg | ORAL_TABLET | Freq: Every day | ORAL | 6 refills | Status: DC

## 2016-11-24 MED ORDER — LINACLOTIDE 290 MCG PO CAPS
290.0000 ug | ORAL_CAPSULE | Freq: Every day | ORAL | 5 refills | Status: DC
Start: 2016-11-24 — End: 2016-12-28

## 2016-11-24 MED FILL — AMLODIPINE BESYLATE 10 MG T: 10 | 30 days supply | Qty: 30 | Fill #0

## 2016-11-24 MED FILL — ?CLONIDINE HCL 0.2 MG TABLE: 0.2 | 30 days supply | Qty: 90 | Fill #0

## 2016-11-24 MED FILL — ISOSORBIDE MN ER 30 MG TAB: 30 | 30 days supply | Qty: 30 | Fill #0

## 2016-11-24 MED FILL — ?CARVEDILOL 6.25 MG TABLET: 6.25 | 30 days supply | Qty: 60 | Fill #0

## 2016-11-24 NOTE — Progress Notes (Signed)
Subjective:  Patient ID: Deborah Miles, female    DOB: Dec 02, 1965  Age: 51 y.o. MRN: [id number]  CC:  HTN and constipation   HPI  Deborah Miles a 51 y.o.femalewith a PMH of HTN, proteinuria, elevated serum creatinine, and uterine fibroids returns to f/u on HTN, abnormal ANA/dsDNA, and elevated serum creatinine presents on f/u of HTN and constipation. Had recently had a renal artery US done which showed some stenosis of the left renal artery at 1-59%. Has an appointment with [facility] to discuss the elevated serum creatinine and positive ANA/dsDNA. Rheumatology has declined to see her until nephrology has evaluated her.     Constipation since 51 years old. Worse now with the ferrous sulfate 325 mg TID. Has tried "all over the counter" remedies but they have failed. Takes a chinese herbal tea that alleviates her constipation.      Outpatient Medications Prior to Visit  Medication Sig Dispense Refill  . amLODipine (NORVASC) 10 MG tablet Take 1 tablet (10 mg total) by mouth daily. 90 tablet 3  . Ascorbic Acid (VITAMIN C) 1000 MG tablet Take 1,000 mg by mouth daily.    . carvedilol (COREG) 6.25 MG tablet Take 1 tablet (6.25 mg total) by mouth 2 (two) times daily with a meal. 90 tablet 3  . cloNIDine (CATAPRES) 0.2 MG tablet Take 1 tablet (0.2 mg total) by mouth 3 (three) times daily. 90 tablet 11  . ferrous sulfate 325 (65 FE) MG tablet Take 1 tablet (325 mg total) by mouth 3 (three) times daily with meals. 90 tablet 0  . Multiple Vitamin (MULTIVITAMIN) tablet Take 1 tablet by mouth daily.    Marland Kitchen senna-docusate (SB DOCUSATE SODIUM/SENNA) 8.6-50 MG tablet Take 1 tablet by mouth at bedtime as needed for mild constipation. 30 tablet 0   No facility-administered medications prior to visit.      ROS Review of Systems  Constitutional: Negative for chills, fever and malaise/fatigue.  Eyes: Negative for blurred vision.  Respiratory: Negative for shortness of breath.    Cardiovascular: Negative for chest pain and palpitations.  Gastrointestinal: Negative for abdominal pain and nausea.  Genitourinary: Negative for dysuria and hematuria.  Musculoskeletal: Negative for joint pain and myalgias.  Skin: Negative for rash.  Neurological: Negative for tingling and headaches.  Psychiatric/Behavioral: Negative for depression. The patient is not nervous/anxious.     Objective:  BP (!) 172/99 (BP Location: Left Arm, Patient Position: Sitting, Cuff Size: Normal)   Pulse 69   Temp 98 F (36.7 C) (Oral)   Wt 162 lb 9.6 oz (73.8 kg)   LMP 11/04/2016 (Approximate)   SpO2 100%   BMI 28.35 kg/m   BP/Weight 11/24/2016 10/27/2016 8/78/6767  Systolic BP 209 470 962  Diastolic BP 99 836 629  Wt. (Lbs) 162.6 159.4 160.5  BMI 28.35 27.79 27.99      Physical Exam  Constitutional: She is oriented to person, place, and time.  Well developed, well nourished, NAD, polite  HENT:  Head: Normocephalic and atraumatic.  Eyes: Conjunctivae are normal. No scleral icterus.  Neck: Normal range of motion. Neck supple. No thyromegaly present.  Cardiovascular: Normal rate, regular rhythm and normal heart sounds.   Pulmonary/Chest: Effort normal and breath sounds normal.  Musculoskeletal: She exhibits no edema.  Neurological: She is alert and oriented to person, place, and time. No cranial nerve deficit. Coordination normal.  Skin: Skin is warm and dry. No rash noted. No erythema. No pallor.  Psychiatric: She has a normal mood  and affect. Her behavior is normal. Thought content normal.  Vitals reviewed.    Assessment & Plan:   1. Elevated serum creatinine - Basic Metabolic Panel  2. Constipation, unspecified constipation type -Begin linaclotide (LINZESS) 290 MCG CAPS capsule; Take 1 capsule (290 mcg total) by mouth daily before breakfast.  Dispense: 30 capsule; Refill: 5  3. Hypertension, unspecified type - Refill carvedilol (COREG) 6.25 MG tablet; Take 1 tablet (6.25  mg total) by mouth 2 (two) times daily with a meal.  Dispense: 90 tablet; Refill: 3 - Refill cloNIDine (CATAPRES) 0.2 MG tablet; Take 1 tablet (0.2 mg total) by mouth 3 (three) times daily.  Dispense: 90 tablet; Refill: 11 - Refill amLODipine (NORVASC) 10 MG tablet; Take 1 tablet (10 mg total) by mouth daily.  Dispense: 90 tablet; Refill: 3 - Begin isosorbide mononitrate (IMDUR) 30 MG 24 hr tablet; Take 1 tablet (30 mg total) by mouth daily.  Dispense: 30 tablet; Refill: 6   Meds ordered this encounter  Medications  . linaclotide (LINZESS) 290 MCG CAPS capsule    Sig: Take 1 capsule (290 mcg total) by mouth daily before breakfast.    Dispense:  30 capsule    Refill:  5    Order Specific Question:   Supervising Provider    Answer:   Tresa Garter [id number]  . carvedilol (COREG) 6.25 MG tablet    Sig: Take 1 tablet (6.25 mg total) by mouth 2 (two) times daily with a meal.    Dispense:  90 tablet    Refill:  3    Order Specific Question:   Supervising Provider    Answer:   Tresa Garter [id number]  . cloNIDine (CATAPRES) 0.2 MG tablet    Sig: Take 1 tablet (0.2 mg total) by mouth 3 (three) times daily.    Dispense:  90 tablet    Refill:  11    Order Specific Question:   Supervising Provider    Answer:   Tresa Garter [id number]  . amLODipine (NORVASC) 10 MG tablet    Sig: Take 1 tablet (10 mg total) by mouth daily.    Dispense:  90 tablet    Refill:  3    Order Specific Question:   Supervising Provider    Answer:   Tresa Garter [id number]  . isosorbide mononitrate (IMDUR) 30 MG 24 hr tablet    Sig: Take 1 tablet (30 mg total) by mouth daily.    Dispense:  30 tablet    Refill:  6    Order Specific Question:   Supervising Provider    Answer:   Tresa Garter [id number]    Follow-up: Return in about 4 weeks (around 12/22/2016) for HTN.   [name] PA

## 2016-11-24 NOTE — Patient Instructions (Signed)

## 2016-11-26 ENCOUNTER — Ambulatory Visit: Payer: Self-pay | Attending: Physician Assistant

## 2016-11-29 ENCOUNTER — Telehealth: Payer: Self-pay

## 2016-11-29 LAB — BASIC METABOLIC PANEL
BUN / CREAT RATIO: 10 (ref 9–23)
BUN: 23 mg/dL (ref 6–24)
CO2: 18 mmol/L — ABNORMAL LOW (ref 20–29)
CREATININE: 2.25 mg/dL — AB (ref 0.57–1.00)
Calcium: 9.3 mg/dL (ref 8.7–10.2)
Chloride: 95 mmol/L — ABNORMAL LOW (ref 96–106)
GFR calc non Af Amer: 25 mL/min/{1.73_m2} — ABNORMAL LOW (ref >59)
GFR, EST AFRICAN AMERICAN: 29 mL/min/{1.73_m2} — AB (ref >59)
GLUCOSE: 94 mg/dL (ref 65–99)
Potassium: 4.3 mmol/L (ref 3.5–5.2)
Sodium: 132 mmol/L — ABNORMAL LOW (ref 134–144)

## 2016-11-29 NOTE — Telephone Encounter (Signed)
-----   Message from [name], PA-C sent at 11/29/2016 10:14 AM EDT ----- Renal function elevated but stable. We will await nephrology consult notes.

## 2016-11-29 NOTE — Telephone Encounter (Signed)
Patient aware of results. Nat Christen, CMA

## 2016-12-01 NOTE — Telephone Encounter (Signed)
FWD to PCP. Tempestt S Roberts, CMA  

## 2016-12-15 MED FILL — !LINZESS 290 MCG CAPSULE: 290 | 30 days supply | Qty: 30 | Fill #0

## 2016-12-20 MED FILL — AMLODIPINE BESYLATE 10 MG T: 10 | 30 days supply | Qty: 30 | Fill #1

## 2016-12-20 MED FILL — ?CARVEDILOL 6.25 MG TABLET: 6.25 | 30 days supply | Qty: 60 | Fill #1

## 2016-12-20 MED FILL — ISOSORBIDE MN ER 30 MG TAB: 30 | 30 days supply | Qty: 30 | Fill #1

## 2016-12-20 MED FILL — ?CLONIDINE HCL 0.2 MG TABLE: 0.2 | 30 days supply | Qty: 90 | Fill #1

## 2016-12-22 ENCOUNTER — Ambulatory Visit (INDEPENDENT_AMBULATORY_CARE_PROVIDER_SITE_OTHER): Payer: Self-pay | Admitting: Physician Assistant

## 2016-12-28 ENCOUNTER — Other Ambulatory Visit: Payer: Self-pay | Admitting: *Deleted

## 2016-12-28 DIAGNOSIS — K5900 Constipation, unspecified: Secondary | ICD-10-CM

## 2016-12-28 MED ORDER — LINACLOTIDE 290 MCG PO CAPS: 290.0000 ug | ORAL_CAPSULE | Freq: Every day | ORAL | 3 refills | Status: AC

## 2016-12-28 NOTE — Telephone Encounter (Signed)
PRINTED FOR PASS PROGRAM 

## 2017-01-03 ENCOUNTER — Ambulatory Visit (INDEPENDENT_AMBULATORY_CARE_PROVIDER_SITE_OTHER): Payer: Self-pay | Admitting: Physician Assistant

## 2017-01-03 ENCOUNTER — Other Ambulatory Visit: Payer: Self-pay

## 2017-01-03 ENCOUNTER — Encounter (INDEPENDENT_AMBULATORY_CARE_PROVIDER_SITE_OTHER): Payer: Self-pay | Admitting: Physician Assistant

## 2017-01-03 VITALS — BP 163/91 | HR 79 | Temp 99.4°F | Wt 159.2 lb

## 2017-01-03 DIAGNOSIS — R7989 Other specified abnormal findings of blood chemistry: Secondary | ICD-10-CM

## 2017-01-03 DIAGNOSIS — I10 Essential (primary) hypertension: Secondary | ICD-10-CM

## 2017-01-03 NOTE — Progress Notes (Signed)
Subjective:  Patient ID: Deborah Miles, female    DOB: November 10, 1965  Age: 51 y.o. MRN: [id number]  CC: f/u HTN  HPI  [name] a 51 y.o.femalewith a PMH of HTN, proteinuria, elevated serum creatinine, and uterine fibroids returns to f/u on HTN, abnormal ANA/dsDNA, and elevated serum creatinine presents on f/u of HTN. Reports taking all medications as directed.  Feeling well, has no symptoms or complaints. Has noted BP to regularly be in the 433 systolic range. One episode of BP 120s/80s and felt very lightheaded. She has a urology consult next month to discuss proteinuria and hematuria.     Outpatient Medications Prior to Visit  Medication Sig Dispense Refill  . amLODipine (NORVASC) 10 MG tablet Take 1 tablet (10 mg total) by mouth daily. 90 tablet 3  . Ascorbic Acid (VITAMIN C) 1000 MG tablet Take 1,000 mg by mouth daily.    . carvedilol (COREG) 6.25 MG tablet Take 1 tablet (6.25 mg total) by mouth 2 (two) times daily with a meal. 90 tablet 3  . cloNIDine (CATAPRES) 0.2 MG tablet Take 1 tablet (0.2 mg total) by mouth 3 (three) times daily. 90 tablet 11  . ferrous sulfate 325 (65 FE) MG tablet TAKE 1 TABLET BY MOUTH THREE TIMES DAILY WITH MEALS 90 tablet 0  . isosorbide mononitrate (IMDUR) 30 MG 24 hr tablet Take 1 tablet (30 mg total) by mouth daily. 30 tablet 6  . linaclotide (LINZESS) 290 MCG CAPS capsule Take 1 capsule (290 mcg total) by mouth daily before breakfast. 90 capsule 3  . Multiple Vitamin (MULTIVITAMIN) tablet Take 1 tablet by mouth daily.    Marland Kitchen senna-docusate (SB DOCUSATE SODIUM/SENNA) 8.6-50 MG tablet Take 1 tablet by mouth at bedtime as needed for mild constipation. 30 tablet 0   No facility-administered medications prior to visit.      ROS Review of Systems  Constitutional: Negative for chills, fever and malaise/fatigue.  Eyes: Negative for blurred vision.  Respiratory: Negative for shortness of breath.   Cardiovascular: Negative for chest  pain and palpitations.  Gastrointestinal: Positive for abdominal pain (occasional epigastric pain). Negative for nausea.  Genitourinary: Negative for dysuria and hematuria.  Musculoskeletal: Negative for joint pain and myalgias.  Skin: Negative for rash.  Neurological: Negative for tingling and headaches.  Psychiatric/Behavioral: Negative for depression. The patient is not nervous/anxious.     Objective:  BP (!) 163/91 (BP Location: Left Arm, Patient Position: Sitting, Cuff Size: Normal)   Pulse 79   Temp 99.4 F (37.4 C) (Oral)   Wt 159 lb 3.2 oz (72.2 kg)   SpO2 100%   BMI 27.76 kg/m   BP/Weight 01/03/2017 11/24/2016 2/95/1884  Systolic BP 166 063 016  Diastolic BP 91 99 010  Wt. (Lbs) 159.2 162.6 159.4  BMI 27.76 28.35 27.79      Physical Exam  Constitutional: She is oriented to person, place, and time.  Well developed, well nourished, NAD, polite  HENT:  Head: Normocephalic and atraumatic.  Eyes: No scleral icterus.  Neck: Normal range of motion. Neck supple. No thyromegaly present.  Cardiovascular: Normal rate, regular rhythm and normal heart sounds.  Pulmonary/Chest: Effort normal and breath sounds normal. No respiratory distress. She has no wheezes.  Abdominal: Soft. Bowel sounds are normal. There is no tenderness.  Mild central obesity. No hernia or mass noted in the epigastrium  Musculoskeletal: She exhibits no edema.  Neurological: She is alert and oriented to person, place, and time. No cranial nerve deficit. Coordination  normal.  Skin: Skin is warm and dry. No rash noted. No erythema. No pallor.  Psychiatric: She has a normal mood and affect. Her behavior is normal. Thought content normal.  Vitals reviewed.    Assessment & Plan:   1. Elevated serum creatinine - Basic Metabolic Panel  2. Hypertension - Begin Hydralazine 25 mg qid. Start half tab for one week then full tab daily thereafter. - Continue on Isosorbide, amlodipine, carvedilol, and  clonidine.   Follow-up: 2-3 months, after urology work up.  [name] PA

## 2017-01-03 NOTE — Patient Instructions (Signed)
Internet crashed. No AVS given.

## 2017-01-04 LAB — BASIC METABOLIC PANEL
BUN / CREAT RATIO: 8 — AB (ref 9–23)
BUN: 19 mg/dL (ref 6–24)
CALCIUM: 9.7 mg/dL (ref 8.7–10.2)
CO2: 22 mmol/L (ref 20–29)
Chloride: 103 mmol/L (ref 96–106)
Creatinine, Ser: 2.5 mg/dL — ABNORMAL HIGH (ref 0.57–1.00)
GFR, EST AFRICAN AMERICAN: 25 mL/min/{1.73_m2} — AB (ref >59)
GFR, EST NON AFRICAN AMERICAN: 22 mL/min/{1.73_m2} — AB (ref >59)
Glucose: 70 mg/dL (ref 65–99)
POTASSIUM: 4.1 mmol/L (ref 3.5–5.2)
Sodium: 138 mmol/L (ref 134–144)

## 2017-01-05 ENCOUNTER — Telehealth (INDEPENDENT_AMBULATORY_CARE_PROVIDER_SITE_OTHER): Payer: Self-pay

## 2017-01-05 NOTE — Telephone Encounter (Signed)
-----   Message from [name], PA-C sent at 01/04/2017  4:47 PM EST ----- Kidney function trending slightly down. We will continue managing BP and make sure to keep appointment with urologist. I plan to send referral to rheumatology again once urology has evaluated her.

## 2017-01-05 NOTE — Telephone Encounter (Signed)
left patient a detailed message with the following results per PCP,     Kidney function trending slightly down. We will continue managing BP and make sure to keep appointment with urologist. I plan to send referral to rheumatology again once urology has evaluated her. Nat Christen, CMA

## 2017-01-14 MED FILL — $Linzess 290mcg Capsule: 290 | 30 days supply | Qty: 30 | Fill #1

## 2017-01-14 MED FILL — ISOSORBIDE MN ER 30 MG TAB: 30 | 30 days supply | Qty: 30 | Fill #2

## 2017-01-14 MED FILL — hydrALAZINE HCL 25 MG TABS: 25 | 30 days supply | Qty: 120 | Fill #0

## 2017-02-04 MED FILL — ?CLONIDINE HCL 0.2 MG TABLE: 0.2 | 30 days supply | Qty: 90 | Fill #2

## 2017-02-04 MED FILL — ?CARVEDILOL 6.25 MG TABLET: 6.25 | 30 days supply | Qty: 60 | Fill #2

## 2017-02-04 MED FILL — AMLODIPINE BESYLATE 10 MG T: 10 | 30 days supply | Qty: 30 | Fill #2

## 2017-02-15 ENCOUNTER — Other Ambulatory Visit: Payer: Self-pay

## 2017-02-15 ENCOUNTER — Encounter (HOSPITAL_COMMUNITY): Payer: Self-pay | Admitting: Internal Medicine

## 2017-02-15 ENCOUNTER — Observation Stay (HOSPITAL_COMMUNITY): Payer: Self-pay

## 2017-02-15 ENCOUNTER — Telehealth (INDEPENDENT_AMBULATORY_CARE_PROVIDER_SITE_OTHER): Payer: Self-pay | Admitting: Physician Assistant

## 2017-02-15 ENCOUNTER — Inpatient Hospital Stay (HOSPITAL_COMMUNITY)
Admission: EM | Admit: 2017-02-15 | Discharge: 2017-02-17 | DRG: 305 | Disposition: A | Discharge status: Home / Self Care | Payer: Self-pay | Attending: Internal Medicine | Admitting: Internal Medicine

## 2017-02-15 DIAGNOSIS — D259 Leiomyoma of uterus, unspecified: Secondary | ICD-10-CM | POA: Diagnosis present

## 2017-02-15 DIAGNOSIS — I160 Hypertensive urgency: Principal | ICD-10-CM | POA: Diagnosis present

## 2017-02-15 DIAGNOSIS — N938 Other specified abnormal uterine and vaginal bleeding: Secondary | ICD-10-CM | POA: Diagnosis present

## 2017-02-15 DIAGNOSIS — F17210 Nicotine dependence, cigarettes, uncomplicated: Secondary | ICD-10-CM | POA: Diagnosis present

## 2017-02-15 DIAGNOSIS — I10 Essential (primary) hypertension: Secondary | ICD-10-CM | POA: Diagnosis present

## 2017-02-15 DIAGNOSIS — N183 Chronic kidney disease, stage 3 unspecified: Secondary | ICD-10-CM | POA: Diagnosis present

## 2017-02-15 DIAGNOSIS — Z8249 Family history of ischemic heart disease and other diseases of the circulatory system: Secondary | ICD-10-CM

## 2017-02-15 DIAGNOSIS — I129 Hypertensive chronic kidney disease with stage 1 through stage 4 chronic kidney disease, or unspecified chronic kidney disease: Secondary | ICD-10-CM | POA: Diagnosis present

## 2017-02-15 DIAGNOSIS — Z23 Encounter for immunization: Secondary | ICD-10-CM

## 2017-02-15 DIAGNOSIS — N1339 Other hydronephrosis: Secondary | ICD-10-CM | POA: Diagnosis present

## 2017-02-15 DIAGNOSIS — I701 Atherosclerosis of renal artery: Secondary | ICD-10-CM | POA: Diagnosis present

## 2017-02-15 DIAGNOSIS — K5900 Constipation, unspecified: Secondary | ICD-10-CM | POA: Diagnosis present

## 2017-02-15 DIAGNOSIS — R42 Dizziness and giddiness: Secondary | ICD-10-CM | POA: Diagnosis present

## 2017-02-15 DIAGNOSIS — E871 Hypo-osmolality and hyponatremia: Secondary | ICD-10-CM | POA: Diagnosis present

## 2017-02-15 DIAGNOSIS — Z716 Tobacco abuse counseling: Secondary | ICD-10-CM

## 2017-02-15 DIAGNOSIS — R55 Syncope and collapse: Secondary | ICD-10-CM | POA: Diagnosis present

## 2017-02-15 DIAGNOSIS — Z720 Tobacco use: Secondary | ICD-10-CM | POA: Diagnosis present

## 2017-02-15 DIAGNOSIS — Z79899 Other long term (current) drug therapy: Secondary | ICD-10-CM

## 2017-02-15 HISTORY — DX: Chronic kidney disease, stage 3 unspecified: N18.30

## 2017-02-15 HISTORY — DX: Chronic kidney disease, stage 3 (moderate): N18.3

## 2017-02-15 LAB — I-STAT TROPONIN, ED: Troponin i, poc: 0 ng/mL (ref 0.00–0.08)

## 2017-02-15 LAB — BASIC METABOLIC PANEL
Anion gap: 10 (ref 5–15)
BUN: 18 mg/dL (ref 6–20)
CHLORIDE: 93 mmol/L — AB (ref 101–111)
CO2: 22 mmol/L (ref 22–32)
CREATININE: 1.92 mg/dL — AB (ref 0.44–1.00)
Calcium: 9.6 mg/dL (ref 8.9–10.3)
GFR calc Af Amer: 34 mL/min — ABNORMAL LOW (ref >60)
GFR, EST NON AFRICAN AMERICAN: 30 mL/min — AB (ref >60)
GLUCOSE: 114 mg/dL — AB (ref 65–99)
Potassium: 4.2 mmol/L (ref 3.5–5.1)
SODIUM: 125 mmol/L — AB (ref 135–145)

## 2017-02-15 LAB — RAPID URINE DRUG SCREEN, HOSP PERFORMED
AMPHETAMINES: NOT DETECTED
BENZODIAZEPINES: NOT DETECTED
Barbiturates: NOT DETECTED
COCAINE: NOT DETECTED
OPIATES: NOT DETECTED
Tetrahydrocannabinol: NOT DETECTED

## 2017-02-15 LAB — CBC
HCT: 38.3 % (ref 36.0–46.0)
Hemoglobin: 13.2 g/dL (ref 12.0–15.0)
MCH: 27.8 pg (ref 26.0–34.0)
MCHC: 34.5 g/dL (ref 30.0–36.0)
MCV: 80.8 fL (ref 78.0–100.0)
PLATELETS: 396 10*3/uL (ref 150–400)
RBC: 4.74 MIL/uL (ref 3.87–5.11)
RDW: 15.5 % (ref 11.5–15.5)
WBC: 11.9 10*3/uL — AB (ref 4.0–10.5)

## 2017-02-15 LAB — URINALYSIS, ROUTINE W REFLEX MICROSCOPIC
Bacteria, UA: NONE SEEN
Bilirubin Urine: NEGATIVE
Glucose, UA: NEGATIVE mg/dL
Ketones, ur: NEGATIVE mg/dL
Leukocytes, UA: NEGATIVE
Nitrite: NEGATIVE
PROTEIN: 100 mg/dL — AB
SPECIFIC GRAVITY, URINE: 1.002 — AB (ref 1.005–1.030)
pH: 7 (ref 5.0–8.0)

## 2017-02-15 LAB — OSMOLALITY, URINE: OSMOLALITY UR: 79 mosm/kg — AB (ref 300–900)

## 2017-02-15 LAB — CREATININE, URINE, RANDOM: Creatinine, Urine: 10.36 mg/dL

## 2017-02-15 LAB — SODIUM, URINE, RANDOM: Sodium, Ur: 18 mmol/L

## 2017-02-15 LAB — POC URINE PREG, ED: PREG TEST UR: NEGATIVE

## 2017-02-15 MED ORDER — CLONIDINE HCL 0.2 MG PO TABS
0.2000 mg | ORAL_TABLET | Freq: Once | ORAL | Status: AC
  Administered 2017-02-15: 0.2 mg via ORAL
  Filled 2017-02-15: qty 1

## 2017-02-15 MED ORDER — POLYETHYLENE GLYCOL 3350 17 G PO PACK
17.0000 g | PACK | Freq: Every day | ORAL | Status: DC
  Administered 2017-02-16 – 2017-02-17 (×2): 17 g via ORAL
  Filled 2017-02-15 (×2): qty 1

## 2017-02-15 MED ORDER — SODIUM CHLORIDE 0.9% FLUSH
3.0000 mL | Freq: Two times a day (BID) | INTRAVENOUS | Status: DC
  Administered 2017-02-16 (×2): 3 mL via INTRAVENOUS

## 2017-02-15 MED ORDER — SENNOSIDES-DOCUSATE SODIUM 8.6-50 MG PO TABS: 1.0000 | ORAL_TABLET | Freq: Every evening | ORAL | Status: DC | PRN

## 2017-02-15 MED ORDER — CLONIDINE HCL 0.1 MG PO TABS
0.2000 mg | ORAL_TABLET | Freq: Three times a day (TID) | ORAL | Status: DC
  Administered 2017-02-16 – 2017-02-17 (×5): 0.2 mg via ORAL
  Filled 2017-02-15: qty 2
  Filled 2017-02-15: qty 1
  Filled 2017-02-15 (×3): qty 2

## 2017-02-15 MED ORDER — HYDRALAZINE HCL 20 MG/ML IJ SOLN
10.0000 mg | Freq: Once | INTRAMUSCULAR | Status: AC
  Administered 2017-02-15: 10 mg via INTRAVENOUS

## 2017-02-15 MED ORDER — HYDRALAZINE HCL 20 MG/ML IJ SOLN
5.0000 mg | INTRAMUSCULAR | Status: DC | PRN
  Filled 2017-02-15: qty 1

## 2017-02-15 MED ORDER — ENOXAPARIN SODIUM 40 MG/0.4ML ~~LOC~~ SOLN: 40.0000 mg | SUBCUTANEOUS | Status: DC

## 2017-02-15 MED ORDER — CARVEDILOL 6.25 MG PO TABS
6.2500 mg | ORAL_TABLET | Freq: Two times a day (BID) | ORAL | Status: DC
  Administered 2017-02-16 – 2017-02-17 (×5): 6.25 mg via ORAL
  Filled 2017-02-15 (×5): qty 1

## 2017-02-15 MED ORDER — SODIUM CHLORIDE 0.9 % IV BOLUS (SEPSIS): 500.0000 mL | Freq: Once | INTRAVENOUS | Status: DC

## 2017-02-15 MED ORDER — VITAMIN C 500 MG PO TABS
1000.0000 mg | ORAL_TABLET | Freq: Every day | ORAL | Status: DC
  Administered 2017-02-16 – 2017-02-17 (×2): 1000 mg via ORAL
  Filled 2017-02-15 (×2): qty 2

## 2017-02-15 MED ORDER — AMLODIPINE BESYLATE 10 MG PO TABS
10.0000 mg | ORAL_TABLET | Freq: Every day | ORAL | Status: DC
  Administered 2017-02-16 – 2017-02-17 (×3): 10 mg via ORAL
  Filled 2017-02-15 (×2): qty 2
  Filled 2017-02-15: qty 1

## 2017-02-15 MED ORDER — HYDRALAZINE HCL 25 MG PO TABS
25.0000 mg | ORAL_TABLET | Freq: Three times a day (TID) | ORAL | Status: DC
  Administered 2017-02-16 – 2017-02-17 (×8): 25 mg via ORAL
  Filled 2017-02-15 (×8): qty 1

## 2017-02-15 MED ORDER — ACETAMINOPHEN 650 MG RE SUPP: 650.0000 mg | Freq: Four times a day (QID) | RECTAL | Status: DC | PRN

## 2017-02-15 MED ORDER — ZOLPIDEM TARTRATE 5 MG PO TABS: 5.0000 mg | ORAL_TABLET | Freq: Every evening | ORAL | Status: DC | PRN

## 2017-02-15 MED ORDER — LINACLOTIDE 145 MCG PO CAPS
290.0000 ug | ORAL_CAPSULE | Freq: Every day | ORAL | Status: DC
  Administered 2017-02-17: 290 ug via ORAL
  Filled 2017-02-15 (×2): qty 2

## 2017-02-15 MED ORDER — FERROUS SULFATE 325 (65 FE) MG PO TABS
325.0000 mg | ORAL_TABLET | Freq: Three times a day (TID) | ORAL | Status: DC
  Administered 2017-02-16 – 2017-02-17 (×5): 325 mg via ORAL
  Filled 2017-02-15 (×7): qty 1

## 2017-02-15 MED ORDER — ISOSORBIDE MONONITRATE ER 30 MG PO TB24
30.0000 mg | ORAL_TABLET | Freq: Every day | ORAL | Status: DC
  Administered 2017-02-16 – 2017-02-17 (×2): 30 mg via ORAL
  Filled 2017-02-15 (×2): qty 1

## 2017-02-15 MED ORDER — ACETAMINOPHEN 325 MG PO TABS
650.0000 mg | ORAL_TABLET | Freq: Four times a day (QID) | ORAL | Status: DC | PRN
  Administered 2017-02-16: 650 mg via ORAL
  Filled 2017-02-15: qty 2

## 2017-02-15 MED ORDER — ONDANSETRON HCL 4 MG PO TABS: 4.0000 mg | ORAL_TABLET | Freq: Four times a day (QID) | ORAL | Status: DC | PRN

## 2017-02-15 MED ORDER — SODIUM CHLORIDE 0.9 % IV SOLN
INTRAVENOUS | Status: DC
  Administered 2017-02-15: 22:00:00 via INTRAVENOUS

## 2017-02-15 MED ORDER — ONDANSETRON HCL 4 MG/2ML IJ SOLN: 4.0000 mg | Freq: Four times a day (QID) | INTRAMUSCULAR | Status: DC | PRN

## 2017-02-15 MED ORDER — ADULT MULTIVITAMIN W/MINERALS CH
1.0000 | ORAL_TABLET | Freq: Every day | ORAL | Status: DC
  Administered 2017-02-16 – 2017-02-17 (×2): 1 via ORAL
  Filled 2017-02-15 (×2): qty 1

## 2017-02-15 NOTE — ED Notes (Signed)
Lab and radiology results reviewed, vital signs reviewed, results discussed with MD and nurse first. 

## 2017-02-15 NOTE — ED Provider Notes (Addendum)
[facility] EMERGENCY DEPARTMENT Provider Note   CSN: [id number] Arrival date & time: 02/15/17  1438     History   Chief Complaint Chief Complaint  Patient presents with  . Dizziness    HPI Deborah Miles is a 52 y.o. female.  HPI   Last Friday was curling hair and suddenly felt lightheaded, was trying to drink water and had syncopal episode. Blood pressure was elevated.  This Saturday was standing in the kitchen, felt lightheaded, sat down, drank water, didn't go out all the way but was close.  Started iron pills, now has constipation for last month or 2.. Has been menstruating Has been drinking a lot of water for the last 3-4 months, drinks 32oz waters 4 times a day.  Has also had vaginal bleeding, menstruating `12/26, then stopped then started again this morning.  Still feels constipated despite that.  Today at work began to feel lightheaded again, while sitting.  No numbness or weakness. No facail droop, no difficulty talking or walking. Had chills earlier.  No cough, no fever, no dysuria. Urinating more often.  Started hydralazine four times per day 12/7. Was taking carvedilol, amlopdipine, imdur, clonidine, imdur. No chest pain or dyspnea.     Past Medical History:  Diagnosis Date  . Bilateral hydronephrosis   . CKD (chronic kidney disease), stage III ([place])   . Elevated serum creatinine   . Fibroid   . Hypertension     Patient Active Problem List   Diagnosis Date Noted  . Hypertensive urgency 02/15/2017  . Syncope 02/15/2017  . Hyponatremia 02/15/2017  . Tobacco abuse 02/15/2017  . CKD (chronic kidney disease), stage III ([place])   . Hypertension   . Elevated serum creatinine   . Bilateral hydronephrosis     Past Surgical History:  Procedure Laterality Date  . TUBAL LIGATION      OB History    Gravida Para Term Preterm AB Living   5       2     SAB TAB Ectopic Multiple Live Births     2            Obstetric Comments   svd x 3       Home Medications    Prior to Admission medications   Medication Sig Start Date End Date Taking? Authorizing Provider  amLODipine (NORVASC) 10 MG tablet Take 1 tablet (10 mg total) by mouth daily. 11/24/16  Yes [name], PA-C  Ascorbic Acid (VITAMIN C) 1000 MG tablet Take 1,000 mg by mouth daily.   Yes [provider]  carvedilol (COREG) 6.25 MG tablet Take 1 tablet (6.25 mg total) by mouth 2 (two) times daily with a meal. 11/24/16  Yes [name], PA-C  cloNIDine (CATAPRES) 0.2 MG tablet Take 1 tablet (0.2 mg total) by mouth 3 (three) times daily. 11/24/16  Yes [name], PA-C  ferrous sulfate 325 (65 FE) MG tablet TAKE 1 TABLET BY MOUTH THREE TIMES DAILY WITH MEALS Patient taking differently: Take 325 mg by mouth three times a day with meals 12/01/16  Yes [name], PA-C  hydrALAZINE (APRESOLINE) 25 MG tablet Take 25 mg by mouth 4 (four) times daily. 01/14/17  Yes [provider]  isosorbide mononitrate (IMDUR) 30 MG 24 hr tablet Take 1 tablet (30 mg total) by mouth daily. 11/24/16  Yes [name], PA-C  linaclotide [facility]) 290 MCG CAPS capsule Take 1 capsule (290 mcg total) by mouth daily before  breakfast. 12/28/16  Yes Tresa Garter, MD  Multiple Vitamin (MULTIVITAMIN) tablet Take 1 tablet by mouth daily.   Yes [provider]  senna-docusate (SB DOCUSATE SODIUM/SENNA) 8.6-50 MG tablet Take 1 tablet by mouth at bedtime as needed for mild constipation. 08/30/16  Yes [name], PA-C    Family History Family History  Problem Relation Age of Onset  . Hypertension Mother   . Diabetes Mother   . Diabetes Father   . Hypertension Father   . Hypertension Brother   . Kidney disease Daughter     Social History Social History   Tobacco Use  . Smoking status: Current Every Day Smoker    Packs/day: 1.00    Types: Cigarettes  . Smokeless tobacco: Never Used  Substance Use Topics  . Alcohol use: Yes     Comment: occasionally  . Drug use: No     Allergies   Patient has no known allergies.   Review of Systems Review of Systems  Constitutional: Negative for fever.  HENT: Negative for sore throat.   Eyes: Negative for visual disturbance.  Respiratory: Negative for cough and shortness of breath.   Cardiovascular: Negative for chest pain.  Gastrointestinal: Positive for constipation. Negative for abdominal pain, diarrhea, nausea and vomiting.  Genitourinary: Positive for vaginal bleeding. Negative for difficulty urinating.  Musculoskeletal: Negative for back pain and neck pain.  Skin: Negative for rash.  Neurological: Positive for syncope and light-headedness. Negative for facial asymmetry, speech difficulty, weakness, numbness and headaches.     Physical Exam Updated Vital Signs BP (!) 186/95   Pulse 93   Temp 98.4 F (36.9 C) (Oral)   Resp 15   Ht 5\' 3"  (1.6 m)   Wt 73.5 kg (162 lb)   SpO2 100%   BMI 28.70 kg/m   Physical Exam  Constitutional: She is oriented to person, place, and time. She appears well-developed and well-nourished. No distress.  HENT:  Head: Normocephalic and atraumatic.  Eyes: Conjunctivae and EOM are normal.  Neck: Normal range of motion.  Cardiovascular: Normal rate, regular rhythm, normal heart sounds and intact distal pulses. Exam reveals no gallop and no friction rub.  No murmur heard. Pulmonary/Chest: Effort normal and breath sounds normal. No respiratory distress. She has no wheezes. She has no rales.  Abdominal: Soft. She exhibits no distension. There is no tenderness. There is no guarding.  Musculoskeletal: She exhibits no edema or tenderness.  Neurological: She is alert and oriented to person, place, and time. She has normal strength. No cranial nerve deficit or sensory deficit. Coordination normal. GCS eye subscore is 4. GCS verbal subscore is 5. GCS motor subscore is 6.  Skin: Skin is warm and dry. No rash noted. She is not diaphoretic.  No erythema.  Nursing note and vitals reviewed.    ED Treatments / Results  Labs (all labs ordered are listed, but only abnormal results are displayed) Labs Reviewed  BASIC METABOLIC PANEL - Abnormal; Notable for the following components:      Result Value   Sodium 125 (*)    Chloride 93 (*)    Glucose, Bld 114 (*)    Creatinine, Ser 1.92 (*)    GFR calc non Af Amer 30 (*)    GFR calc Af Amer 34 (*)    All other components within normal limits  CBC - Abnormal; Notable for the following components:   WBC 11.9 (*)    All other components within normal limits  URINALYSIS, ROUTINE W  REFLEX MICROSCOPIC - Abnormal; Notable for the following components:   Color, Urine COLORLESS (*)    Specific Gravity, Urine 1.002 (*)    Hgb urine dipstick MODERATE (*)    Protein, ur 100 (*)    Squamous Epithelial / LPF 0-5 (*)    All other components within normal limits  OSMOLALITY, URINE - Abnormal; Notable for the following components:   Osmolality, Ur 79 (*)    All other components within normal limits  RAPID URINE DRUG SCREEN, HOSP PERFORMED  SODIUM, URINE, RANDOM  CREATININE, URINE, RANDOM  OSMOLALITY  TSH  HCG, QUANTITATIVE, PREGNANCY  BASIC METABOLIC PANEL  CBC  BASIC METABOLIC PANEL  BASIC METABOLIC PANEL  BASIC METABOLIC PANEL  I-STAT TROPONIN, ED  POC URINE PREG, ED    EKG  EKG Interpretation  Date/Time:  Wednesday February 16 2017 00:44:32 EST Ventricular Rate:  90 PR Interval:    QRS Duration: 95 QT Interval:  383 QTC Calculation: 469 R Axis:   38 Text Interpretation:  Sinus rhythm Probable left atrial enlargement ST elev, probable normal early repol pattern No previous ECGs available Confirmed by Gareth Morgan [phone]) on 02/16/2017 12:48:14 AM       Radiology Mr Brain Wo Contrast  Result Date: 02/16/2017 CLINICAL DATA:  Dizziness and syncope. EXAM: MRI HEAD WITHOUT CONTRAST TECHNIQUE: Multiplanar, multiecho pulse sequences of the brain and surrounding structures  were obtained without intravenous contrast. COMPARISON:  None. FINDINGS: Brain: The midline structures are normal. There is no acute infarct or acute hemorrhage. No mass lesion, hydrocephalus, dural abnormality or extra-axial collection. There is beginning confluent hyperintense T2-weighted signal within the periventricular and deep white matter, most often seen in the setting of chronic microvascular ischemia, which is markedly advanced for age in this case. No age-advanced or lobar predominant atrophy. Mineralization versus chronic microhemorrhage in the right caudate head. Bilateral lentiform nucleus mineralization. Vascular: Major intracranial arterial and venous sinus flow voids are preserved. Skull and upper cervical spine: The visualized skull base, calvarium, upper cervical spine and extracranial soft tissues are normal. Sinuses/Orbits: No fluid levels or advanced mucosal thickening. No mastoid or middle ear effusion. Normal orbits. 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 No acute abnormality. Electronically Signed   By: Ulyses Jarred M.D.   On: 02/16/2017 00:35    Procedures Procedures (including critical care time)  Medications Ordered in ED Medications  0.9 %  sodium chloride infusion ( Intravenous New Bag/Given 02/15/17 2225)  hydrALAZINE (APRESOLINE) injection 5 mg (not administered)  amLODipine (NORVASC) tablet 10 mg (not administered)  vitamin C (ASCORBIC ACID) tablet 1,000 mg (not administered)  carvedilol (COREG) tablet 6.25 mg (not administered)  cloNIDine (CATAPRES) tablet 0.2 mg (not administered)  ferrous sulfate tablet 325 mg (not administered)  hydrALAZINE (APRESOLINE) tablet 25 mg (not administered)  isosorbide mononitrate (IMDUR) 24 hr tablet 30 mg (not administered)  linaclotide (LINZESS) capsule 290 mcg (not administered)  multivitamin with minerals tablet 1 tablet (not administered)    senna-docusate (Senokot-S) tablet 1 tablet (not administered)  polyethylene glycol (MIRALAX / GLYCOLAX) packet 17 g (not administered)  sodium chloride flush (NS) 0.9 % injection 3 mL (not administered)  acetaminophen (TYLENOL) tablet 650 mg (not administered)    Or  acetaminophen (TYLENOL) suppository 650 mg (not administered)  ondansetron (ZOFRAN) tablet 4 mg (not administered)    Or  ondansetron (ZOFRAN) injection 4 mg (not administered)  zolpidem (AMBIEN) tablet 5 mg (not administered)  cloNIDine (CATAPRES) tablet  0.2 mg (0.2 mg Oral Given 02/15/17 2157)  hydrALAZINE (APRESOLINE) injection 10 mg (10 mg Intravenous Given 02/15/17 2224)     Initial Impression / Assessment and Plan / ED Course  I have reviewed the triage vital signs and the nursing notes.  Pertinent labs & imaging results that were available during my care of the patient were reviewed by me and considered in my medical decision making (see chart for details).     52 year old female with a history of hypertension presents with concern for lightheadedness. EKG without significant findings. Labs significant for new hyponatremia, with a sodium of 125, from 138 on prior evaluation in November.  Patient appears normovolemic on exam.  Possible etiologies include significant water intake, as well as change in blood pressure medication, however will need admission for further evaluation of etiology and correction.  Patient is significantly hypertensive in the emergency department, however denies any symptoms of hypertensive emergency.  Episodes of lightheadedness are not associated with vertigo, and she has no other neurologic symptoms, have low suspicion for CVA. Given home clonidine.  Patient likely with symptomatic hyponatremia.  Will admit for continued care.  Final Clinical Impressions(s) / ED Diagnoses   Final diagnoses:  Hyponatremia    ED Discharge Orders    None       Gareth Morgan, MD 02/16/17 7505     Gareth Morgan, MD 02/16/17 [id number]

## 2017-02-15 NOTE — ED Triage Notes (Signed)
Pt states she has had 2 dizziness spells in the last week, last Saturday she stood up and became very dizzy and lasted about 10 minutes. Pt states now she feels normal with no dizziness with no pain but earlier today about 12pm she felt like she was going to have another spell but didn't get as bad as the others before, Pt concerned her BP may be elevated due to be off of one of the medications she used to take for HTN.

## 2017-02-15 NOTE — ED Notes (Signed)
Patient transported to MRI 

## 2017-02-15 NOTE — H&P (Addendum)
History and Physical    Deborah Miles DOB: [birth year] DOA: 02/15/2017  Referring MD/NP/PA:   PCP: [name], PA-C   Patient coming from:  The patient is coming from home.  At baseline, pt is independent for most of ADL.   Chief Complaint: syncope  HPI: Deborah Miles is a 52 y.o. female with medical history significant of hypertension, tobacco abuse, CKD-3, fibroids, bilateral hydronephrosis, who presents with syncope.  Patient states that she has been having intermittent dizziness and lightheadedness in the past week. She had one episode of syncope on Friday per her son, patient lost consciousness for a few seconds. She denies any unilateral weakness, numbness or tingling in extremities. No seizure activities. No vision change, hearing loss, slurred speech or facial droop. Patient denies any chest pain, shortness of breath, cough, fever or chills. She is constipated, but no nausea, vomiting or abdominal pain. Patient states that she has been drinking a lot of water due to constipation.  ED Course: pt was found to have Sodium 125, stable renal function, WBC 7.9, negative troponin, temperature normal, elevated blood pressure 219/104, heart rate 90s, oxygen saturation 100% on room air. Patient is placed on telemetry bed for observation.   Review of Systems:   General: no fevers, chills, no body weight gain, has fatigue HEENT: no blurry vision, hearing changes or sore throat Respiratory: no dyspnea, coughing, wheezing CV: no chest pain, no palpitations GI: no nausea, vomiting, abdominal pain, diarrhea, has constipation GU: no dysuria, burning on urination, increased urinary frequency, hematuria  Ext: no leg edema Neuro: no unilateral weakness, numbness, or tingling, no vision change or hearing loss. Has dizziness and syncope. Skin: no rash, no skin tear. MSK: No muscle spasm, no deformity, no limitation of range of movement in spin Heme: No easy  bruising.  Travel history: No recent long distant travel.  Allergy: No Known Allergies  Past Medical History:  Diagnosis Date  . Bilateral hydronephrosis   . CKD (chronic kidney disease), stage III ([place])   . Elevated serum creatinine   . Fibroid   . Hypertension     Past Surgical History:  Procedure Laterality Date  . TUBAL LIGATION      Social History:  reports that she has been smoking cigarettes.  She has been smoking about 1.00 pack per day. she has never used smokeless tobacco. She reports that she drinks alcohol. She reports that she does not use drugs.  Family History:  Family History  Problem Relation Age of Onset  . Hypertension Mother   . Diabetes Mother   . Diabetes Father   . Hypertension Father   . Hypertension Brother   . Kidney disease Daughter      Prior to Admission medications   Medication Sig Start Date End Date Taking? Authorizing Provider  amLODipine (NORVASC) 10 MG tablet Take 1 tablet (10 mg total) by mouth daily. 11/24/16  Yes [name], PA-C  Ascorbic Acid (VITAMIN C) 1000 MG tablet Take 1,000 mg by mouth daily.   Yes [provider]  carvedilol (COREG) 6.25 MG tablet Take 1 tablet (6.25 mg total) by mouth 2 (two) times daily with a meal. 11/24/16  Yes [name], PA-C  cloNIDine (CATAPRES) 0.2 MG tablet Take 1 tablet (0.2 mg total) by mouth 3 (three) times daily. 11/24/16  Yes [name], PA-C  ferrous sulfate 325 (65 FE) MG tablet TAKE 1 TABLET BY MOUTH THREE TIMES DAILY WITH MEALS Patient taking differently: Take  325 mg by mouth three times a day with meals 12/01/16  Yes [name], PA-C  hydrALAZINE (APRESOLINE) 25 MG tablet Take 25 mg by mouth 4 (four) times daily. 01/14/17  Yes [provider]  isosorbide mononitrate (IMDUR) 30 MG 24 hr tablet Take 1 tablet (30 mg total) by mouth daily. 11/24/16  Yes [name], PA-C  linaclotide [facility]) 290 MCG CAPS capsule Take 1 capsule (290 mcg  total) by mouth daily before breakfast. 12/28/16  Yes Tresa Garter, MD  Multiple Vitamin (MULTIVITAMIN) tablet Take 1 tablet by mouth daily.   Yes [provider]  senna-docusate (SB DOCUSATE SODIUM/SENNA) 8.6-50 MG tablet Take 1 tablet by mouth at bedtime as needed for mild constipation. 08/30/16  Yes [name], PA-C    Physical Exam: Vitals:   02/15/17 2147 02/15/17 2157 02/15/17 2200 02/15/17 2224  BP: (!) 185/112 (!) 185/112 (!) 213/100 (!) 213/100  Pulse:      Resp: 18  18   Temp:      TempSrc:      SpO2:      Weight:      Height:       General: Not in acute distress HEENT:       Eyes: PERRL, EOMI, no scleral icterus.       ENT: No discharge from the ears and nose, no pharynx injection, no tonsillar enlargement.        Neck: No JVD, no bruit, no mass felt. Heme: No neck lymph node enlargement. Cardiac: S1/S2, RRR, No murmurs, No gallops or rubs. Respiratory: No rales, wheezing, rhonchi or rubs. GI: Soft, nondistended, nontender, no rebound pain, no organomegaly, BS present. GU: No hematuria Ext: No pitting leg edema bilaterally. 2+DP/PT pulse bilaterally. Musculoskeletal: No joint deformities, No joint redness or warmth, no limitation of ROM in spin. Skin: No rashes.  Neuro: Alert, oriented X3, cranial nerves II-XII grossly intact, moves all extremities normally.  Psych: Patient is not psychotic, no suicidal or hemocidal ideation.  Labs on Admission: I have personally reviewed following labs and imaging studies  CBC: Recent Labs  Lab 02/15/17 1458  WBC 11.9*  HGB 13.2  HCT 38.3  MCV 80.8  PLT 938   Basic Metabolic Panel: Recent Labs  Lab 02/15/17 1458  NA 125*  K 4.2  CL 93*  CO2 22  GLUCOSE 114*  BUN 18  CREATININE 1.92*  CALCIUM 9.6   GFR: Estimated Creatinine Clearance: 34 mL/min (A) (by C-G formula based on SCr of 1.92 mg/dL (H)). Liver Function Tests: No results for input(s): AST, ALT, ALKPHOS, BILITOT, PROT, ALBUMIN in  the last 168 hours. No results for input(s): LIPASE, AMYLASE in the last 168 hours. No results for input(s): AMMONIA in the last 168 hours. Coagulation Profile: No results for input(s): INR, PROTIME in the last 168 hours. Cardiac Enzymes: No results for input(s): CKTOTAL, CKMB, CKMBINDEX, TROPONINI in the last 168 hours. BNP (last 3 results) No results for input(s): PROBNP in the last 8760 hours. HbA1C: No results for input(s): HGBA1C in the last 72 hours. CBG: No results for input(s): GLUCAP in the last 168 hours. Lipid Profile: No results for input(s): CHOL, HDL, LDLCALC, TRIG, CHOLHDL, LDLDIRECT in the last 72 hours. Thyroid Function Tests: No results for input(s): TSH, T4TOTAL, FREET4, T3FREE, THYROIDAB in the last 72 hours. Anemia Panel: No results for input(s): VITAMINB12, FOLATE, FERRITIN, TIBC, IRON, RETICCTPCT in the last 72 hours. Urine analysis:    Component Value Date/Time   COLORURINE  COLORLESS (A) 02/15/2017 2138   APPEARANCEUR CLEAR 02/15/2017 2138   LABSPEC 1.002 (L) 02/15/2017 2138   PHURINE 7.0 02/15/2017 2138   GLUCOSEU NEGATIVE 02/15/2017 2138   HGBUR MODERATE (A) 02/15/2017 2138   BILIRUBINUR NEGATIVE 02/15/2017 2138   BILIRUBINUR negative 09/08/2016 [place] 02/15/2017 2138   PROTEINUR 100 (A) 02/15/2017 2138   UROBILINOGEN 0.2 09/08/2016 0923   NITRITE NEGATIVE 02/15/2017 2138   LEUKOCYTESUR NEGATIVE 02/15/2017 2138   Sepsis Labs: @LABRCNTIP (procalcitonin:4,lacticidven:4) )No results found for this or any previous visit (from the past 240 hour(s)).   Radiological Exams on Admission: No results found.   EKG: Not done in ED, will get one.   Assessment/Plan Principal Problem:   Syncope Active Problems:   Hypertension   Hypertensive urgency   Hyponatremia   CKD (chronic kidney disease), stage III ([facility])   Tobacco abuse   Syncope: Etiology is not clear. The differential diagnosis is broad, including vasovagal syncope, TIA,  arrhythmia, drug abuse, orthostatic status, carotid artery stenosis. No seizure activity note per her son. No focal neurological findings on physical examination.  - Please on tele bed for obs - Orthostatic vital signs  - MRI-brain - 2d echo - Neuro checks  - IVF: NS 100 cc/h  Hypertensive urgency: Blood pressure 219/104. Patient states that she has been compliant to her blood pressure medications at home. -Continue home medications: Amlodipine, clonidine, Coreg,hydralazine hydralazine -IV hydralazine when necessary  CKD (chronic kidney disease), stage III: stable. Baseline creatinine 1.7-2.2. Her creatinine is 1.72, BUN 18. Excellent-follow-up renal function by BMP  Hyponatremia: Most likely due to overtaking pure water. Patient states that she has been drinking a lot of to water due to constipation - Will check urine sodium, urine osmolality, serum osmolality. - check TSH - Fluid restriction - IVF: 100 mL/h of NS - check BMP q6h  Addendum: repeated BMP showed Na 125-->132 (in ~6.5 hours), correction is too quick. -will d/c NS  -start D5-1/2 NS at 38 cc/h  Tobacco abuse: -Did counseling about importance of quitting smoking -Nicotine patch  DVT ppx: SCD Code Status: Full code Family Communication:  Yes, patient's  Son and daughter  at bed side Disposition Plan:  Anticipate discharge back to previous home environment Consults called:  nonbe Admission status: Obs / tele    Date of Service 02/15/2017    Ivor Costa [facility] Pager [phone]  If 7PM-7AM, please contact night-coverage [email or web address] Password [facility] 02/15/2017, 10:48 PM

## 2017-02-15 NOTE — Telephone Encounter (Signed)
Noted. I will be reviewing her ED note.

## 2017-02-15 NOTE — ED Notes (Signed)
ED Provider at bedside. 

## 2017-02-15 NOTE — Telephone Encounter (Signed)
Pt called to let you know that she is going to the ED since she feel her BP is very high,

## 2017-02-16 ENCOUNTER — Other Ambulatory Visit (HOSPITAL_COMMUNITY): Payer: Self-pay

## 2017-02-16 ENCOUNTER — Observation Stay (HOSPITAL_BASED_OUTPATIENT_CLINIC_OR_DEPARTMENT_OTHER): Payer: Self-pay

## 2017-02-16 ENCOUNTER — Encounter (HOSPITAL_COMMUNITY): Payer: Self-pay | Admitting: General Practice

## 2017-02-16 DIAGNOSIS — R55 Syncope and collapse: Secondary | ICD-10-CM

## 2017-02-16 DIAGNOSIS — R42 Dizziness and giddiness: Secondary | ICD-10-CM

## 2017-02-16 DIAGNOSIS — I10 Essential (primary) hypertension: Secondary | ICD-10-CM

## 2017-02-16 LAB — BASIC METABOLIC PANEL
ANION GAP: 8 (ref 5–15)
ANION GAP: 8 (ref 5–15)
ANION GAP: 7 (ref 5–15)
BUN: 16 mg/dL (ref 6–20)
BUN: 16 mg/dL (ref 6–20)
BUN: 20 mg/dL (ref 6–20)
CALCIUM: 9 mg/dL (ref 8.9–10.3)
CALCIUM: 9.1 mg/dL (ref 8.9–10.3)
CHLORIDE: 101 mmol/L (ref 101–111)
CHLORIDE: 102 mmol/L (ref 101–111)
CO2: 22 mmol/L (ref 22–32)
CO2: 24 mmol/L (ref 22–32)
CO2: 23 mmol/L (ref 22–32)
CREATININE: 1.98 mg/dL — AB (ref 0.44–1.00)
CREATININE: 2.13 mg/dL — AB (ref 0.44–1.00)
Calcium: 9.3 mg/dL (ref 8.9–10.3)
Chloride: 102 mmol/L (ref 101–111)
Creatinine, Ser: 2.05 mg/dL — ABNORMAL HIGH (ref 0.44–1.00)
GFR calc non Af Amer: 28 mL/min — ABNORMAL LOW (ref >60)
GFR calc non Af Amer: 26 mL/min — ABNORMAL LOW (ref >60)
GFR, EST AFRICAN AMERICAN: 32 mL/min — AB (ref >60)
GFR, EST AFRICAN AMERICAN: 33 mL/min — AB (ref >60)
GFR, EST AFRICAN AMERICAN: 30 mL/min — AB (ref >60)
GFR, EST NON AFRICAN AMERICAN: 27 mL/min — AB (ref >60)
GLUCOSE: 127 mg/dL — AB (ref 65–99)
Glucose, Bld: 100 mg/dL — ABNORMAL HIGH (ref 65–99)
Glucose, Bld: 106 mg/dL — ABNORMAL HIGH (ref 65–99)
POTASSIUM: 3.8 mmol/L (ref 3.5–5.1)
Potassium: 4 mmol/L (ref 3.5–5.1)
Potassium: 4.3 mmol/L (ref 3.5–5.1)
SODIUM: 132 mmol/L — AB (ref 135–145)
SODIUM: 133 mmol/L — AB (ref 135–145)
SODIUM: 132 mmol/L — AB (ref 135–145)

## 2017-02-16 LAB — CBC
HCT: 35.6 % — ABNORMAL LOW (ref 36.0–46.0)
HEMOGLOBIN: 12.1 g/dL (ref 12.0–15.0)
MCH: 27.7 pg (ref 26.0–34.0)
MCHC: 34 g/dL (ref 30.0–36.0)
MCV: 81.5 fL (ref 78.0–100.0)
Platelets: 315 10*3/uL (ref 150–400)
RBC: 4.37 MIL/uL (ref 3.87–5.11)
RDW: 15.6 % — ABNORMAL HIGH (ref 11.5–15.5)
WBC: 9.8 10*3/uL (ref 4.0–10.5)

## 2017-02-16 LAB — ECHOCARDIOGRAM COMPLETE
HEIGHTINCHES: 63 in
Weight: 2592 oz

## 2017-02-16 LAB — TSH: TSH: 1.023 u[IU]/mL (ref 0.350–4.500)

## 2017-02-16 LAB — GLUCOSE, CAPILLARY: Glucose-Capillary: 89 mg/dL (ref 65–99)

## 2017-02-16 LAB — OSMOLALITY: OSMOLALITY: 277 mosm/kg (ref 275–295)

## 2017-02-16 LAB — HCG, QUANTITATIVE, PREGNANCY: HCG, BETA CHAIN, QUANT, S: 1 m[IU]/mL (ref <5)

## 2017-02-16 LAB — CBG MONITORING, ED: Glucose-Capillary: 141 mg/dL — ABNORMAL HIGH (ref 65–99)

## 2017-02-16 MED ORDER — DEXTROSE-NACL 5-0.45 % IV SOLN
INTRAVENOUS | Status: DC
  Administered 2017-02-16: 75 mL/h via INTRAVENOUS

## 2017-02-16 MED ORDER — PNEUMOCOCCAL VAC POLYVALENT 25 MCG/0.5ML IJ INJ
0.5000 mL | INJECTION | INTRAMUSCULAR | Status: AC
  Administered 2017-02-17: 0.5 mL via INTRAMUSCULAR

## 2017-02-16 NOTE — ED Notes (Signed)
Breakfast ordered by secretary.  

## 2017-02-16 NOTE — ED Notes (Signed)
Rec's sent to lab for add on's

## 2017-02-16 NOTE — ED Notes (Signed)
Unable to do EKG at this moment due to pt not in room

## 2017-02-16 NOTE — Progress Notes (Addendum)
PROGRESS NOTE    Deborah Miles  ZJI:[id number] DOB: 13-Dec-1965 DOA: 02/15/2017 PCP: [name], PA-C   Outpatient Specialists:    Brief Narrative:    Deborah Miles is a 52 y.o. female with medical history significant of hypertension, tobacco abuse, CKD-3, fibroids, bilateral hydronephrosis, who presents with syncope.  Patient states that she has been having intermittent dizziness and lightheadedness in the past week. She had one episode of syncope on Friday per her son, patient lost consciousness for a few seconds. She denies any unilateral weakness, numbness or tingling in extremities. No seizure activities. No vision change, hearing loss, slurred speech or facial droop. Patient denies any chest pain, shortness of breath, cough, fever or chills. She is constipated, but no nausea, vomiting or abdominal pain. Patient states that she has been drinking a lot of water due to constipation.   Per patient, when she lived in [place], her BP was well controlled on lotrel 10/40   Assessment & Plan:   Principal Problem:   Syncope Active Problems:   Hypertension   Hypertensive urgency   Hyponatremia   CKD (chronic kidney disease), stage III (HCC)   Tobacco abuse  Dizziness:  -patient denies syncope -MRI with:Markedly advanced white matter disease for age, most commonly thesequela of chronic ischemic microangiopathy. No acute abnormality---- most likely due to LONG term uncontrolled BP Suspect dizziness related to BP elevations -tele  - 2d echo - Neuro checks   Fibroids with ? Causing hydronephrosis -getting urology follow up outpatient -gyn has seen and does not think hydronephrosis is related to fibroids  Hypertensive urgency: Blood pressure 219/104. Patient states that she has been compliant to her blood pressure medications at home. -Continue home medications: Amlodipine, clonidine, Coreg,hydralazine hydralazine -renin/aldosterone level ordered -renal a duplex  as well--- had one done in sept that showed Left 1-59% renal artery stenosis -if patient truly compliant with meds and continues to have BP issues, may need renal consult  CKD (chronic kidney disease), stage III: stable. Baseline creatinine 1.7-2.2.  -follow BMP  Hyponatremia: Most likely due to increased PO water intake -improving -d/c fluid and monitor  Tobacco abuse: -Did counseling about importance of quitting smoking -Nicotine patch     DVT prophylaxis:  SCD's  Code Status: Full Code   Family Communication: At bedside  Disposition Plan:  Pending work up     Subjective: No dizziness currently  Objective: Vitals:   02/16/17 1100 02/16/17 1115 02/16/17 1200 02/16/17 1234  BP: (!) 160/91 (!) 156/90 131/79 (!) 158/91  Pulse: 71 68 73 (!) 59  Resp:    18  Temp:    98.8 F (37.1 C)  TempSrc:    Oral  SpO2: 98% 98% 98% 96%  Weight:      Height:       No intake or output data in the 24 hours ending 02/16/17 1452 Filed Weights   02/15/17 2028  Weight: 73.5 kg (162 lb)    Examination:  General exam: Appears calm and comfortable  Respiratory system: Clear to auscultation. Respiratory effort normal. Cardiovascular system: S1 & S2 heard, RRR. No JVD, murmurs, rubs, gallops or clicks. No pedal edema. Gastrointestinal system: Abdomen is nondistended, soft and nontender. No organomegaly or masses felt. Normal bowel sounds heard. Central nervous system: Alert and oriented. No focal neurological deficits. Extremities: Symmetric 5 x 5 power. Skin: No rashes, lesions or ulcers Psychiatry: Judgement and insight appear normal. Mood & affect appropriate.     Data Reviewed: I have personally  reviewed following labs and imaging studies  CBC: Recent Labs  Lab 02/15/17 1458 02/16/17 0453  WBC 11.9* 9.8  HGB 13.2 12.1  HCT 38.3 35.6*  MCV 80.8 81.5  PLT 396 299   Basic Metabolic Panel: Recent Labs  Lab 02/15/17 1458 02/16/17 0453 02/16/17 1248  NA  125* 132* 133*  K 4.2 3.8 4.0  CL 93* 102 101  CO2 22 22 24   GLUCOSE 114* 127* 100*  BUN 18 16 16   CREATININE 1.92* 2.05* 1.98*  CALCIUM 9.6 9.0 9.3   GFR: Estimated Creatinine Clearance: 33 mL/min (A) (by C-G formula based on SCr of 1.98 mg/dL (H)). Liver Function Tests: No results for input(s): AST, ALT, ALKPHOS, BILITOT, PROT, ALBUMIN in the last 168 hours. No results for input(s): LIPASE, AMYLASE in the last 168 hours. No results for input(s): AMMONIA in the last 168 hours. Coagulation Profile: No results for input(s): INR, PROTIME in the last 168 hours. Cardiac Enzymes: No results for input(s): CKTOTAL, CKMB, CKMBINDEX, TROPONINI in the last 168 hours. BNP (last 3 results) No results for input(s): PROBNP in the last 8760 hours. HbA1C: No results for input(s): HGBA1C in the last 72 hours. CBG: Recent Labs  Lab 02/16/17 0509 02/16/17 1341  GLUCAP 141* 89   Lipid Profile: No results for input(s): CHOL, HDL, LDLCALC, TRIG, CHOLHDL, LDLDIRECT in the last 72 hours. Thyroid Function Tests: Recent Labs    02/16/17 0453  TSH 1.023   Anemia Panel: No results for input(s): VITAMINB12, FOLATE, FERRITIN, TIBC, IRON, RETICCTPCT in the last 72 hours. Urine analysis:    Component Value Date/Time   COLORURINE COLORLESS (A) 02/15/2017 2138   APPEARANCEUR CLEAR 02/15/2017 2138   LABSPEC 1.002 (L) 02/15/2017 2138   PHURINE 7.0 02/15/2017 2138   GLUCOSEU NEGATIVE 02/15/2017 2138   HGBUR MODERATE (A) 02/15/2017 2138   BILIRUBINUR NEGATIVE 02/15/2017 2138   BILIRUBINUR negative 09/08/2016 [place] 02/15/2017 2138   PROTEINUR 100 (A) 02/15/2017 2138   UROBILINOGEN 0.2 09/08/2016 0923   NITRITE NEGATIVE 02/15/2017 2138   LEUKOCYTESUR NEGATIVE 02/15/2017 2138   Sepsis Labs: @LABRCNTIP (procalcitonin:4,lacticidven:4)  )No results found for this or any previous visit (from the past 240 hour(s)).    Anti-infectives (From admission, onward)   None        Radiology Studies: Mr Brain Wo Contrast  Result Date: 02/16/2017 CLINICAL DATA:  Dizziness and syncope. EXAM: MRI HEAD WITHOUT CONTRAST TECHNIQUE: Multiplanar, multiecho pulse sequences of the brain and surrounding structures were obtained without intravenous contrast. COMPARISON:  None. FINDINGS: Brain: The midline structures are normal. There is no acute infarct or acute hemorrhage. No mass lesion, hydrocephalus, dural abnormality or extra-axial collection. There is beginning confluent hyperintense T2-weighted signal within the periventricular and deep white matter, most often seen in the setting of chronic microvascular ischemia, which is markedly advanced for age in this case. No age-advanced or lobar predominant atrophy. Mineralization versus chronic microhemorrhage in the right caudate head. Bilateral lentiform nucleus mineralization. Vascular: Major intracranial arterial and venous sinus flow voids are preserved. Skull and upper cervical spine: The visualized skull base, calvarium, upper cervical spine and extracranial soft tissues are normal. Sinuses/Orbits: No fluid levels or advanced mucosal thickening. No mastoid or middle ear effusion. Normal orbits. 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 No acute abnormality. Electronically Signed   By: Ulyses Jarred M.D.   On: 02/16/2017 00:35        Scheduled Meds: . amLODipine  10 mg Oral Daily  . carvedilol  6.25 mg Oral BID WC  . cloNIDine  0.2 mg Oral Q8H  . ferrous sulfate  325 mg Oral TID WC  . hydrALAZINE  25 mg Oral TID AC & HS  . isosorbide mononitrate  30 mg Oral Daily  . linaclotide  290 mcg Oral QAC breakfast  . multivitamin with minerals  1 tablet Oral Daily  . [START ON 02/17/2017] pneumococcal 23 valent vaccine  0.5 mL Intramuscular Tomorrow-1000  . polyethylene glycol  17 g Oral Daily  . sodium chloride flush  3 mL Intravenous Q12H  . vitamin  C  1,000 mg Oral Daily   Continuous Infusions:   LOS: 0 days    Time spent: 35 min    Geradine Girt, DO [facility] Pager [phone]  If 7PM-7AM, please contact night-coverage [email or web address] Password [email or web address] 02/16/2017, 2:52 PM

## 2017-02-16 NOTE — ED Notes (Signed)
Attempted report 

## 2017-02-16 NOTE — Progress Notes (Signed)
  Echocardiogram 2D Echocardiogram has been performed.  Jennette Dubin 02/16/2017, 4:46 PM

## 2017-02-17 ENCOUNTER — Inpatient Hospital Stay (HOSPITAL_COMMUNITY): Payer: Self-pay

## 2017-02-17 ENCOUNTER — Ambulatory Visit (INDEPENDENT_AMBULATORY_CARE_PROVIDER_SITE_OTHER): Payer: Self-pay | Admitting: Physician Assistant

## 2017-02-17 DIAGNOSIS — I10 Essential (primary) hypertension: Secondary | ICD-10-CM

## 2017-02-17 DIAGNOSIS — Z720 Tobacco use: Secondary | ICD-10-CM

## 2017-02-17 LAB — GLUCOSE, CAPILLARY: Glucose-Capillary: 129 mg/dL — ABNORMAL HIGH (ref 65–99)

## 2017-02-17 LAB — BASIC METABOLIC PANEL
Anion gap: 8 (ref 5–15)
BUN: 17 mg/dL (ref 6–20)
CHLORIDE: 99 mmol/L — AB (ref 101–111)
CO2: 26 mmol/L (ref 22–32)
Calcium: 9.2 mg/dL (ref 8.9–10.3)
Creatinine, Ser: 2.17 mg/dL — ABNORMAL HIGH (ref 0.44–1.00)
GFR calc Af Amer: 30 mL/min — ABNORMAL LOW (ref >60)
GFR calc non Af Amer: 25 mL/min — ABNORMAL LOW (ref >60)
GLUCOSE: 139 mg/dL — AB (ref 65–99)
POTASSIUM: 3.8 mmol/L (ref 3.5–5.1)
Sodium: 133 mmol/L — ABNORMAL LOW (ref 135–145)

## 2017-02-17 NOTE — Discharge Summary (Signed)
Physician Discharge Summary  Deborah Miles [name]:[id number] DOB: 04-30-1965 DOA: 02/15/2017  PCP: [name], PA-C  Admit date: 02/15/2017 Discharge date: 02/17/2017  Time spent: 45 minutes  Recommendations for Outpatient Follow-up:  Patient will be discharged to home.  Patient will need to follow up with primary care provider within one week of discharge, repeat BMP and discuss blood pressure management.  Patient should continue medications as prescribed.  Patient should follow a heart healthy diet.   Discharge Diagnoses:  Dizziness Hypertensive urgency CKD, stage III Hyponatremia Tobacco abuse Fibroids, possibly causing hydronephrosis  Discharge Condition: Stable  Diet recommendation: heart healthy  Filed Weights   02/15/17 2028  Weight: 73.5 kg (162 lb)    History of present illness:  On 02/15/2017 by Dr. Ivor Costa Deborah Miles is a 52 y.o. female with medical history significant of hypertension, tobacco abuse, CKD-3, fibroids, bilateral hydronephrosis, who presents with syncope.  Patient states that she has been having intermittent dizziness and lightheadedness in the past week. She had one episode of syncope on Friday per her son, patient lost consciousness for a few seconds. She denies any unilateral weakness, numbness or tingling in extremities. No seizure activities. No vision change, hearing loss, slurred speech or facial droop. Patient denies any chest pain, shortness of breath, cough, fever or chills. She is constipated, but no nausea, vomiting or abdominal pain. Patient states that she has been drinking a lot of water due to constipation.  Hospital Course:  Dizziness -Patient denied syncope -MRI brain: Markedly advanced white matter disease for age, most commonly the sequela of chronic ischemic microangiopathy. No acute abnormality -Echocardiogram shows EF of 60-65% -Possibly related to patient's uncontrolled blood pressure -Patient feels  dizziness has improved  Hypertensive urgency -Blood pressure noted to be 219/104 -Patient states that she used to be on Lotrel 10/40, however given her kidney issues, this was discontinued. She states this controlled her blood pressure much better -Blood pressure much more controlled, currently 140/73 -Patient had renal artery duplex September 2018 which showed left 1-59% renal artery stenosis -Repeat renal US unchanged from prior -Renin aldosterone level ordered and pending- can be followed by PCP -Continue amlodipine, Coreg, clonidine, hydralazine, Imdur  CKD, stage III -Baseline creatinine ranges from 1.7 and 2.2 -Currently 2.13  Hyponatremia -Possibly due to increased oral intake -Patient was placed on IV fluids, sodium improved from 125 upon admission. Currently 132 -Repeat BMP in 1 week  Tobacco abuse -Discussed smoking cessation, continue nicotine patch  Fibroids, possibly causing hydronephrosis -Follow-up with urology as an outpatient -Oncology does not believe that hydronephrosis is related to her fibroids  Procedures: Echocardiogram  Consultations: None  Discharge Exam: Vitals:   02/17/17 1016 02/17/17 1424  BP: 140/73 (!) 152/88  Pulse: 73 74  Resp: 20 18  Temp: 98 F (36.7 C) 98.1 F (36.7 C)  SpO2: 100% 100%   Denies further dizziness. States she is feeling better and would like to go home. Denies chest pain, shortness of breath, abdominal pain, N/V/D/C.    General: Well developed, well nourished, NAD, appears stated age  52: NCAT, mucous membranes moist.  Cardiovascular: S1 S2 auscultated, RRR, no murmurs  Respiratory: Clear to auscultation bilaterally with equal chest rise  Abdomen: Soft, nontender, nondistended, + bowel sounds  Extremities: warm dry without cyanosis clubbing or edema  Neuro: AAOx3, nonfocal  Psych: Normal affect and demeanor with intact judgement and insight  Discharge Instructions Discharge Instructions    Discharge  instructions   Complete by:  As directed    Patient will be discharged to home.  Patient will need to follow up with primary care provider within one week of discharge, repeat BMP and discuss blood pressure management.  Patient should continue medications as prescribed.  Patient should follow a heart healthy diet.     Allergies as of 02/17/2017   No Known Allergies     Medication List    TAKE these medications   amLODipine 10 MG tablet Commonly known as:  NORVASC Take 1 tablet (10 mg total) by mouth daily.   carvedilol 6.25 MG tablet Commonly known as:  COREG Take 1 tablet (6.25 mg total) by mouth 2 (two) times daily with a meal.   cloNIDine 0.2 MG tablet Commonly known as:  CATAPRES Take 1 tablet (0.2 mg total) by mouth 3 (three) times daily.   ferrous sulfate 325 (65 FE) MG tablet TAKE 1 TABLET BY MOUTH THREE TIMES DAILY WITH MEALS What changed:    how much to take  how to take this  when to take this   hydrALAZINE 25 MG tablet Commonly known as:  APRESOLINE Take 25 mg by mouth 4 (four) times daily.   isosorbide mononitrate 30 MG 24 hr tablet Commonly known as:  IMDUR Take 1 tablet (30 mg total) by mouth daily.   linaclotide 290 MCG Caps capsule Commonly known as:  LINZESS Take 1 capsule (290 mcg total) by mouth daily before breakfast.   multivitamin tablet Take 1 tablet by mouth daily.   senna-docusate 8.6-50 MG tablet Commonly known as:  SB DOCUSATE SODIUM/SENNA Take 1 tablet by mouth at bedtime as needed for mild constipation.   vitamin C 1000 MG tablet Take 1,000 mg by mouth daily.      No Known Allergies    The results of significant diagnostics from this hospitalization (including imaging, microbiology, ancillary and laboratory) are listed below for reference.    Significant Diagnostic Studies: Mr Brain Wo Contrast  Result Date: 02/16/2017 CLINICAL DATA:  Dizziness and syncope. EXAM: MRI HEAD WITHOUT CONTRAST TECHNIQUE: Multiplanar, multiecho  pulse sequences of the brain and surrounding structures were obtained without intravenous contrast. COMPARISON:  None. FINDINGS: Brain: The midline structures are normal. There is no acute infarct or acute hemorrhage. No mass lesion, hydrocephalus, dural abnormality or extra-axial collection. There is beginning confluent hyperintense T2-weighted signal within the periventricular and deep white matter, most often seen in the setting of chronic microvascular ischemia, which is markedly advanced for age in this case. No age-advanced or lobar predominant atrophy. Mineralization versus chronic microhemorrhage in the right caudate head. Bilateral lentiform nucleus mineralization. Vascular: Major intracranial arterial and venous sinus flow voids are preserved. Skull and upper cervical spine: The visualized skull base, calvarium, upper cervical spine and extracranial soft tissues are normal. Sinuses/Orbits: No fluid levels or advanced mucosal thickening. No mastoid or middle ear effusion. Normal orbits. 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 No acute abnormality. Electronically Signed   By: Ulyses Jarred M.D.   On: 02/16/2017 00:35    Microbiology: No results found for this or any previous visit (from the past 240 hour(s)).   Labs: Basic Metabolic Panel: Recent Labs  Lab 02/15/17 1458 02/16/17 0453 02/16/17 1248 02/16/17 1929 02/17/17 0926  NA 125* 132* 133* 132* 133*  K 4.2 3.8 4.0 4.3 3.8  CL 93* 102 101 102 99*  CO2 22 22 24 23 26   GLUCOSE 114* 127* 100* 106* 139*  BUN  18 16 16 20 17   CREATININE 1.92* 2.05* 1.98* 2.13* 2.17*  CALCIUM 9.6 9.0 9.3 9.1 9.2   Liver Function Tests: No results for input(s): AST, ALT, ALKPHOS, BILITOT, PROT, ALBUMIN in the last 168 hours. No results for input(s): LIPASE, AMYLASE in the last 168 hours. No results for input(s): AMMONIA in the last 168 hours. CBC: Recent Labs  Lab  02/15/17 1458 02/16/17 0453  WBC 11.9* 9.8  HGB 13.2 12.1  HCT 38.3 35.6*  MCV 80.8 81.5  PLT 396 315   Cardiac Enzymes: No results for input(s): CKTOTAL, CKMB, CKMBINDEX, TROPONINI in the last 168 hours. BNP: BNP (last 3 results) No results for input(s): BNP in the last 8760 hours.  ProBNP (last 3 results) No results for input(s): PROBNP in the last 8760 hours.  CBG: Recent Labs  Lab 02/16/17 0509 02/16/17 1341 02/17/17 0602  GLUCAP 141* 89 129*       Signed:  Troi Bechtold  [facility] 02/17/2017, 4:30 PM

## 2017-02-17 NOTE — Progress Notes (Signed)
Renal artery duplex prelim: right WNL, left 1-59% stenosis. Unchanged from study 10/2016 Landry Mellow, RDMS, RVT

## 2017-02-17 NOTE — Plan of Care (Signed)
  Activity: Risk for activity intolerance will decrease 02/17/2017 0607 - Progressing by Irish Lack, RN   Clinical Measurements: Ability to maintain clinical measurements within normal limits will improve 02/17/2017 0607 - Progressing by Irish Lack, RN   Elimination: Will not experience complications related to bowel motility 02/17/2017 0607 - Progressing by Irish Lack, RN

## 2017-02-17 NOTE — Care Management Note (Signed)
Case Management Note  Patient Details  Name: Deborah Miles MRN: [id number] Date of Birth: December 11, 1965  Subjective/Objective:     Pt admitted with syncope. She is from home with family. Pt is active with [facility] and uses [facility] pharmacy for her medications.                Action/Plan: Pt discharging home with self care. She has transportation home. No further needs per CM.   Expected Discharge Date:  02/17/17               Expected Discharge Plan:  Home/Self Care  In-House Referral:     Discharge planning Services     Post Acute Care Choice:    Choice offered to:     DME Arranged:    DME Agency:     HH Arranged:    HH Agency:     Status of Service:  Completed, signed off  If discussed at [employer] of Stay Meetings, dates discussed:    Additional Comments:  Pollie Friar, RN 02/17/2017, 4:38 PM

## 2017-02-17 NOTE — Progress Notes (Signed)
Discharge instructions reviewed with pt.  Copy of instructions given to pt. No scripts. Pt eating dinner. Will call front desk when ready to go.    At 37  Pt d/c'd via wheelchair with belongings, with family.           Escorted by unit NT.

## 2017-02-20 LAB — ALDOSTERONE + RENIN ACTIVITY W/ RATIO
ALDO / PRA Ratio: 45.4 — ABNORMAL HIGH (ref 0.0–30.0)
ALDOSTERONE: 10.4 ng/dL (ref 0.0–30.0)
PRA LC/MS/MS: 0.229 ng/mL/hr (ref 0.167–5.380)

## 2017-02-23 ENCOUNTER — Encounter (INDEPENDENT_AMBULATORY_CARE_PROVIDER_SITE_OTHER): Payer: Self-pay | Admitting: Physician Assistant

## 2017-02-23 ENCOUNTER — Other Ambulatory Visit: Payer: Self-pay

## 2017-02-23 ENCOUNTER — Ambulatory Visit (INDEPENDENT_AMBULATORY_CARE_PROVIDER_SITE_OTHER): Payer: Self-pay | Admitting: Physician Assistant

## 2017-02-23 VITALS — BP 199/115 | HR 91 | Temp 98.7°F | Wt 158.2 lb

## 2017-02-23 DIAGNOSIS — I10 Essential (primary) hypertension: Secondary | ICD-10-CM

## 2017-02-23 DIAGNOSIS — N924 Excessive bleeding in the premenopausal period: Secondary | ICD-10-CM

## 2017-02-23 DIAGNOSIS — K5900 Constipation, unspecified: Secondary | ICD-10-CM

## 2017-02-23 MED ORDER — POLYETHYLENE GLYCOL 3350 17 GM/SCOOP PO POWD: 17.0000 g | Freq: Every day | ORAL | 1 refills | Status: AC

## 2017-02-23 MED ORDER — AMLODIPINE BESY-BENAZEPRIL HCL 10-40 MG PO CAPS: 1.0000 | ORAL_CAPSULE | Freq: Every day | ORAL | 5 refills | Status: AC

## 2017-02-23 MED ORDER — CLONIDINE HCL 0.1 MG PO TABS: 0.1000 mg | ORAL_TABLET | Freq: Every day | ORAL | 5 refills | Status: AC

## 2017-02-23 MED ORDER — LEVONORGESTREL-ETHINYL ESTRAD 90-20 MCG PO TABS: 1.0000 | ORAL_TABLET | Freq: Every day | ORAL | 11 refills | Status: AC

## 2017-02-23 MED FILL — LEVONOR-ETH ESTRA 0.09-0.02: 90-20 | 28 days supply | Qty: 28 | Fill #0

## 2017-02-23 MED FILL — AMLODIPINE-BENAZEPRIL 10-20: 10-20 | 30 days supply | Qty: 30 | Fill #0

## 2017-02-23 MED FILL — POLYETHYLENE GLYCOL 3350 PO: 30 days supply | Qty: 510 | Fill #0

## 2017-02-23 NOTE — Patient Instructions (Signed)
Ethinyl Estradiol; Levonorgestrel tablets What is this medicine? ETHINYL ESTRADIOL; LEVONORGESTREL (ETH in il es tra DYE ole; LEE voh nor jes trel) is an oral contraceptive. It combines two types of female hormones, an estrogen and a progestin. They are used to prevent ovulation and pregnancy. This medicine may be used for other purposes; ask your health care provider or pharmacist if you have questions. COMMON BRAND NAME(S): [place], [place], Amethia, Amethia Lo, [place], [place], Aubra-28, Aviane, Camrese, Camrese Lo, [place], [place], [place], [place], [place], [place], [place], [place], [place], [place], [place], [place], [place], [place], Levonorgestrel/Ethinyl Estradiol, [place], [place], [place], [place], [place], [place], [place], [place], [place], [place], [place], [place], [place], [place], [place], [place], [place], Reinaldo Berber What should I tell my health care provider before I take this medicine? They need to know if you have or ever had any of these conditions: -abnormal vaginal bleeding -blood vessel disease or blood clots -breast, cervical, endometrial, ovarian, liver, or uterine cancer -diabetes -gallbladder disease -heart disease or recent heart attack -high blood pressure -high cholesterol -kidney disease -liver disease -migraine headaches -stroke -systemic lupus erythematosus (SLE) -tobacco smoker -an unusual or allergic reaction to estrogens, progestins, other medicines, foods, dyes, or preservatives -pregnant or trying to get pregnant -breast-feeding How should I use this medicine? Take this medicine by mouth. To reduce nausea, this medicine may be taken with food. Follow the directions on the prescription label. Take this medicine at the same time each day and in the order directed on the package. Do not take your medicine more often than directed. Contact your pediatrician regarding the use of this medicine in children. Special care may be  needed. This medicine has been used in female children who have started having menstrual periods. A patient package insert for the product will be given with each prescription and refill. Read this sheet carefully each time. The sheet may change frequently. Overdosage: If you think you have taken too much of this medicine contact a poison control center or emergency room at once. NOTE: This medicine is only for you. Do not share this medicine with others. What if I miss a dose? If you miss a dose, refer to the patient information sheet you received with your medicine for direction. If you miss more than one pill, this medicine may not be as effective and you may need to use another form of birth control. What may interact with this medicine? Do not take this medicine with the following medication: -dasabuvir; ombitasvir; paritaprevir; ritonavir -ombitasvir; paritaprevir; ritonavir This medicine may also interact with the following medications: -acetaminophen -antibiotics or medicines for infections, especially rifampin, rifabutin, rifapentine, and griseofulvin, and possibly penicillins or tetracyclines -aprepitant -ascorbic acid (vitamin C) -atorvastatin -barbiturate medicines, such as phenobarbital -bosentan -carbamazepine -caffeine -clofibrate -cyclosporine -dantrolene -doxercalciferol -felbamate -grapefruit juice -hydrocortisone -medicines for anxiety or sleeping problems, such as diazepam or temazepam -medicines for diabetes, including pioglitazone -mineral oil -modafinil -mycophenolate -nefazodone -oxcarbazepine -phenytoin -prednisolone -ritonavir or other medicines for HIV infection or AIDS -rosuvastatin -selegiline -soy isoflavones supplements -St. John's wort -tamoxifen or raloxifene -theophylline -thyroid hormones -topiramate -warfarin This list may not describe all possible interactions. Give your health care provider a list of all the medicines, herbs,  non-prescription drugs, or dietary supplements you use. Also tell them if you smoke, drink alcohol, or use illegal drugs. Some items may interact with your medicine. What should I watch for while using this medicine? Visit your doctor or health care professional for regular checks on your progress. You will need a regular breast and pelvic  exam and Pap smear while on this medicine. Use an additional method of contraception during the first cycle that you take these tablets. If you have any reason to think you are pregnant, stop taking this medicine right away and contact your doctor or health care professional. If you are taking this medicine for hormone related problems, it may take several cycles of use to see improvement in your condition. Smoking increases the risk of getting a blood clot or having a stroke while you are taking birth control pills, especially if you are more than 50 years old. You are strongly advised not to smoke. This medicine can make your body retain fluid, making your fingers, hands, or ankles swell. Your blood pressure can go up. Contact your doctor or health care professional if you feel you are retaining fluid. This medicine can make you more sensitive to the sun. Keep out of the sun. If you cannot avoid being in the sun, wear protective clothing and use sunscreen. Do not use sun lamps or tanning beds/booths. If you wear contact lenses and notice visual changes, or if the lenses begin to feel uncomfortable, consult your eye care specialist. In some women, tenderness, swelling, or minor bleeding of the gums may occur. Notify your dentist if this happens. Brushing and flossing your teeth regularly may help limit this. See your dentist regularly and inform your dentist of the medicines you are taking. If you are going to have elective surgery, you may need to stop taking this medicine before the surgery. Consult your health care professional for advice. This medicine does not  protect you against HIV infection (AIDS) or any other sexually transmitted diseases. What side effects may I notice from receiving this medicine? Side effects that you should report to your doctor or health care professional as soon as possible: -breast tissue changes or discharge -changes in vaginal bleeding during your period or between your periods -chest pain -coughing up blood -dizziness or fainting spells -headaches or migraines -leg, arm or groin pain -severe or sudden headaches -stomach pain (severe) -sudden shortness of breath -sudden loss of coordination, especially on one side of the body -speech problems -symptoms of vaginal infection like itching, irritation or unusual discharge -tenderness in the upper abdomen -vomiting -weakness or numbness in the arms or legs, especially on one side of the body -yellowing of the eyes or skin Side effects that usually do not require medical attention (report to your doctor or health care professional if they continue or are bothersome): -breakthrough bleeding and spotting that continues beyond the 3 initial cycles of pills -breast tenderness -mood changes, anxiety, depression, frustration, anger, or emotional outbursts -increased sensitivity to sun or ultraviolet light -nausea -skin rash, acne, or brown spots on the skin -weight gain (slight) This list may not describe all possible side effects. Call your doctor for medical advice about side effects. You may report side effects to FDA at 1-800-FDA-1088. Where should I keep my medicine? Keep out of the reach of children. Store at room temperature between 15 and 30 degrees C (59 and 86 degrees F). Throw away any unused medicine after the expiration date. NOTE: This sheet is a summary. It may not cover all possible information. If you have questions about this medicine, talk to your doctor, pharmacist, or health care provider.  2018 Elsevier/Gold Standard (2015-10-06 07:58:22)  

## 2017-02-23 NOTE — Progress Notes (Signed)
Subjective:  Patient ID: Deborah Miles, female    DOB: 1965-12-19  Age: 52 y.o. MRN: [id number]  CC: hospital f/u  HPI  Millisa Giarrusso Cueis a 52 y.o.femalewith a PMH of HTN, proteinuria, elevated serum creatinine, and uterine fibroids presents on hospital f/u. Had briefly syncopized and was admitted 02/15/17 to 02/17/17. Was diagnosed with hyponatremia, CKD, and HTN. 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 Hyponatremia thought to be due to increased oral intake of water which patient reports she has been doing lately. States she is well now but would like to be prescribed Lotrel and clonidine as these medications were previously prescribed to her by previous PCP. She also spoke to her nephrologist about Lotrel and he said he would stop other antihypertensives and start Lotrel while monitoring her kidney function. She insists she was normotensive with Lotrel and is aware that she may have worsening renal function with Lotrel. Patient says she does not think she has an autoimmune disease despite her ANA positive with elevation of SSA. Although, she is willing to see a rheumatologist here, says she will likely go back to [facility] in the coming months for her healthcare since they have free healthcare through the "[facility] clinic". Would like a prescription for occasional constipation and for heavy bleeding during menstrual period. Denies family/personal hx of breast cancer and does not smoke.      Outpatient Medications Prior to Visit  Medication Sig Dispense Refill  . amLODipine (NORVASC) 10 MG tablet Take 1 tablet (10 mg total) by mouth daily. 90 tablet 3  . Ascorbic Acid (VITAMIN C) 1000 MG tablet Take 1,000 mg by mouth daily.    . cloNIDine (CATAPRES) 0.2 MG tablet Take 1 tablet (0.2 mg total) by mouth 3 (three) times daily. 90 tablet 11  . ferrous sulfate 325 (65 FE) MG tablet TAKE 1 TABLET BY MOUTH THREE TIMES DAILY WITH  MEALS (Patient taking differently: Take 325 mg by mouth three times a day with meals) 90 tablet 0  . Multiple Vitamin (MULTIVITAMIN) tablet Take 1 tablet by mouth daily.    . carvedilol (COREG) 6.25 MG tablet Take 1 tablet (6.25 mg total) by mouth 2 (two) times daily with a meal. (Patient not taking: Reported on 02/23/2017) 90 tablet 3  . hydrALAZINE (APRESOLINE) 25 MG tablet Take 25 mg by mouth 4 (four) times daily.  2  . isosorbide mononitrate (IMDUR) 30 MG 24 hr tablet Take 1 tablet (30 mg total) by mouth daily. (Patient not taking: Reported on 02/23/2017) 30 tablet 6  . linaclotide (LINZESS) 290 MCG CAPS capsule Take 1 capsule (290 mcg total) by mouth daily before breakfast. (Patient not taking: Reported on 02/23/2017) 90 capsule 3  . senna-docusate (SB DOCUSATE SODIUM/SENNA) 8.6-50 MG tablet Take 1 tablet by mouth at bedtime as needed for mild constipation. (Patient not taking: Reported on 02/23/2017) 30 tablet 0   No facility-administered medications prior to visit.      ROS Review of Systems  Constitutional: Negative for chills, fever and malaise/fatigue.  Eyes: Negative for blurred vision.  Respiratory: Negative for shortness of breath.   Cardiovascular: Negative for chest pain and palpitations.  Gastrointestinal: Negative for abdominal pain and nausea.  Genitourinary: Negative for dysuria and hematuria.       Heavy vaginal bleeding  Musculoskeletal: Negative for joint pain and myalgias.  Skin: Negative for rash.  Neurological: Negative for tingling and headaches.  Psychiatric/Behavioral: Negative for depression. The patient  is not nervous/anxious.     Objective:  BP (!) 199/115 (BP Location: Left Arm, Patient Position: Sitting, Cuff Size: Normal)   Pulse 91   Temp 98.7 F (37.1 C) (Oral)   Wt 158 lb 3.2 oz (71.8 kg)   LMP 02/15/2017 (Exact Date)   SpO2 99%   BMI 28.02 kg/m   BP/Weight 02/23/2017 4/00/8676 02/17/5091  Systolic BP 267 124 -  Diastolic BP 580 88 -  Wt. (Lbs)  158.2 - 162  BMI 28.02 - 28.7      Physical Exam  Constitutional: She is oriented to person, place, and time.  Well developed, well nourished, NAD, polite  HENT:  Head: Normocephalic and atraumatic.  Eyes: No scleral icterus.  Neck: Normal range of motion. Neck supple. No thyromegaly present.  Cardiovascular: Normal rate, regular rhythm and normal heart sounds.  Pulmonary/Chest: Effort normal and breath sounds normal.  Musculoskeletal: She exhibits no edema.  Neurological: She is alert and oriented to person, place, and time. No cranial nerve deficit. Coordination normal.  Skin: Skin is warm and dry. No rash noted. No erythema. No pallor.  Psychiatric: She has a normal mood and affect. Her behavior is normal. Thought content normal.  Vitals reviewed.    Assessment & Plan:    1. Hypertension, unspecified type - amLODipine-benazepril (LOTREL) 10-40 MG capsule; Take 1 capsule by mouth daily.  Dispense: 30 capsule; Refill: 5 - Basic Metabolic Panel - CBC with Differential - cloNIDine (CATAPRES) 0.1 MG tablet; Take 1 tablet (0.1 mg total) by mouth daily.  Dispense: 30 tablet; Refill: 5  2. Constipation, unspecified constipation type -  polyethylene glycol powder (GLYCOLAX/MIRALAX) powder; Take 17 g by mouth daily.  Dispense: 3350 g; Refill: 1  3. Excessive bleeding in premenopausal period - Begin levonorgestrel-ethinyl estradiol (LYBREL,AMETHYST) 90-20 MCG tablet; Take 1 tablet by mouth daily.  Dispense: 1 Package; Refill: 11   Meds ordered this encounter  Medications  . amLODipine-benazepril (LOTREL) 10-40 MG capsule    Sig: Take 1 capsule by mouth daily.    Dispense:  30 capsule    Refill:  5    Order Specific Question:   Supervising Provider    Answer:   Tresa Garter [id number]  . polyethylene glycol powder (GLYCOLAX/MIRALAX) powder    Sig: Take 17 g by mouth daily.    Dispense:  3350 g    Refill:  1    Order Specific Question:   Supervising Provider     Answer:   Tresa Garter [id number]  . cloNIDine (CATAPRES) 0.1 MG tablet    Sig: Take 1 tablet (0.1 mg total) by mouth daily.    Dispense:  30 tablet    Refill:  5    Order Specific Question:   Supervising Provider    Answer:   Tresa Garter [id number]  . levonorgestrel-ethinyl estradiol (LYBREL,AMETHYST) 90-20 MCG tablet    Sig: Take 1 tablet by mouth daily.    Dispense:  1 Package    Refill:  11    Order Specific Question:   Supervising Provider    Answer:   Tresa Garter [id number]    Follow-up: Return in about 4 weeks (around 03/23/2017) for HTN.   Tonya Wantz Roderic Ovens PA  * Dr. Doreene Burke was briefly consulted about this patient's ARR result and patient's need for rheumatology referral.

## 2017-02-24 LAB — CBC WITH DIFFERENTIAL/PLATELET
BASOS ABS: 0 10*3/uL (ref 0.0–0.2)
Basos: 0 %
EOS (ABSOLUTE): 0.3 10*3/uL (ref 0.0–0.4)
Eos: 3 %
HEMOGLOBIN: 13.3 g/dL (ref 11.1–15.9)
Hematocrit: 39.6 % (ref 34.0–46.6)
Immature Grans (Abs): 0.1 10*3/uL (ref 0.0–0.1)
Immature Granulocytes: 1 %
LYMPHS ABS: 1.9 10*3/uL (ref 0.7–3.1)
Lymphs: 17 %
MCH: 28.2 pg (ref 26.6–33.0)
MCHC: 33.6 g/dL (ref 31.5–35.7)
MCV: 84 fL (ref 79–97)
MONOS ABS: 0.5 10*3/uL (ref 0.1–0.9)
Monocytes: 4 %
NEUTROS ABS: 8.3 10*3/uL — AB (ref 1.4–7.0)
Neutrophils: 75 %
PLATELETS: 391 10*3/uL — AB (ref 150–379)
RBC: 4.71 x10E6/uL (ref 3.77–5.28)
RDW: 17.2 % — AB (ref 12.3–15.4)
WBC: 11 10*3/uL — AB (ref 3.4–10.8)

## 2017-02-24 LAB — BASIC METABOLIC PANEL
BUN / CREAT RATIO: 7 — AB (ref 9–23)
BUN: 15 mg/dL (ref 6–24)
CHLORIDE: 100 mmol/L (ref 96–106)
CO2: 23 mmol/L (ref 20–29)
Calcium: 10.4 mg/dL — ABNORMAL HIGH (ref 8.7–10.2)
Creatinine, Ser: 2.08 mg/dL — ABNORMAL HIGH (ref 0.57–1.00)
GFR calc non Af Amer: 27 mL/min/{1.73_m2} — ABNORMAL LOW (ref >59)
GFR, EST AFRICAN AMERICAN: 32 mL/min/{1.73_m2} — AB (ref >59)
GLUCOSE: 94 mg/dL (ref 65–99)
POTASSIUM: 4.5 mmol/L (ref 3.5–5.2)
SODIUM: 137 mmol/L (ref 134–144)

## 2017-02-25 ENCOUNTER — Telehealth (INDEPENDENT_AMBULATORY_CARE_PROVIDER_SITE_OTHER): Payer: Self-pay

## 2017-02-25 NOTE — Telephone Encounter (Signed)
Left patient the following message, Sodium back to normal, serum creatinine at baseline. WBC slightly elevated and can be due to inflammation. Monitor with another BMP and CBC in one week. Please call the office to schedule lab visit. Nat Christen, CMA

## 2017-02-25 NOTE — Telephone Encounter (Signed)
-----   Message from [name], PA-C sent at 02/24/2017  6:32 PM EST ----- Sodium back to normal, serum creatinine at baseline. WBC slightly elevated and can be due to inflammation. Monitor with another BMP and CBC in one week.

## 2017-03-01 ENCOUNTER — Telehealth (INDEPENDENT_AMBULATORY_CARE_PROVIDER_SITE_OTHER): Payer: Self-pay | Admitting: Physician Assistant

## 2017-03-01 ENCOUNTER — Other Ambulatory Visit (INDEPENDENT_AMBULATORY_CARE_PROVIDER_SITE_OTHER): Payer: Self-pay

## 2017-03-01 DIAGNOSIS — Z23 Encounter for immunization: Secondary | ICD-10-CM

## 2017-03-01 NOTE — Telephone Encounter (Signed)
Pt came to the office requesting a copy of her immunization records, she need it today if possible, please call her back if you can have or not the records for today, please follow up

## 2017-03-24 ENCOUNTER — Ambulatory Visit (INDEPENDENT_AMBULATORY_CARE_PROVIDER_SITE_OTHER): Payer: Self-pay | Admitting: Physician Assistant

## 2018-10-14 IMAGING — US US RENAL
1 series · 14 of 25 positions shown · non-contrast
Comparison: None.

CLINICAL DATA: Elevated creatinine

EXAM:
RENAL / URINARY TRACT ULTRASOUND COMPLETE

[Series 1: us renal · 0.23mm/px · 14 of 50 slices shown]
[im 1/50]
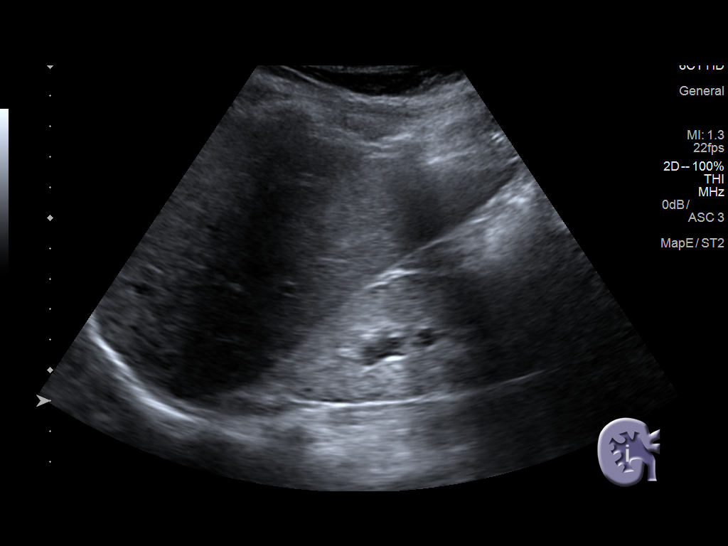
[im 5/50]
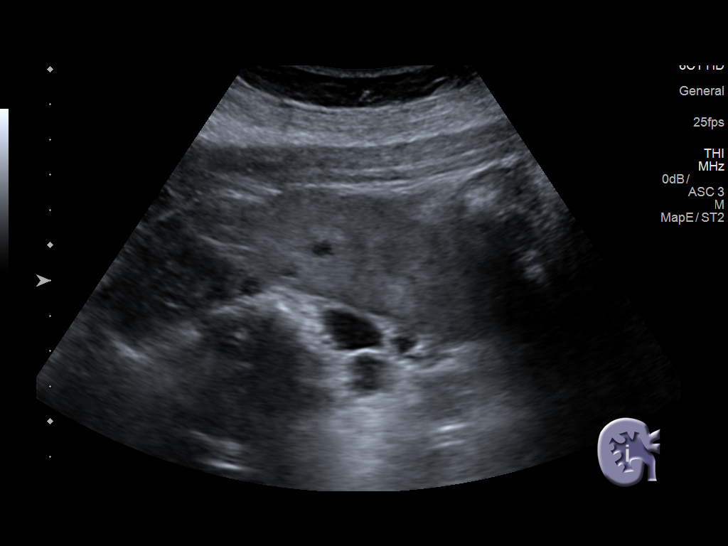
[im 9/50]
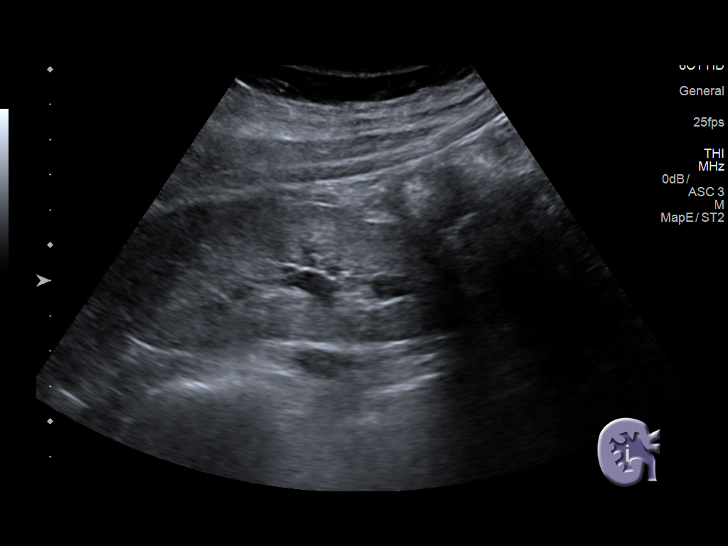
[im 13/50]
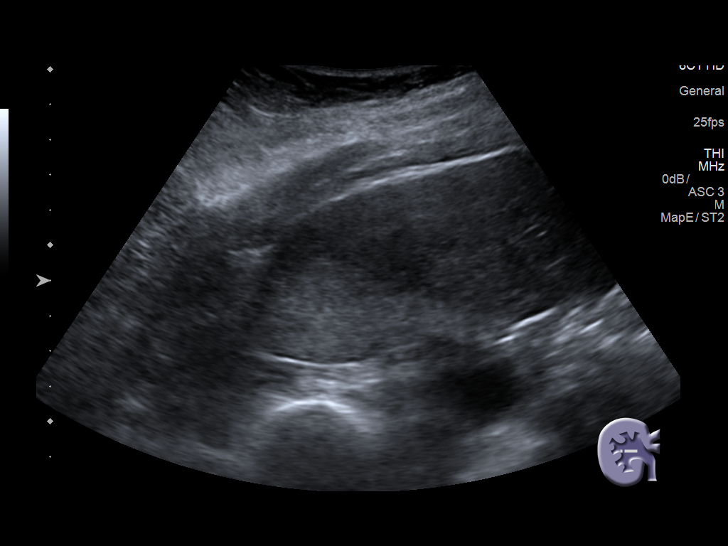
[im 17/50]
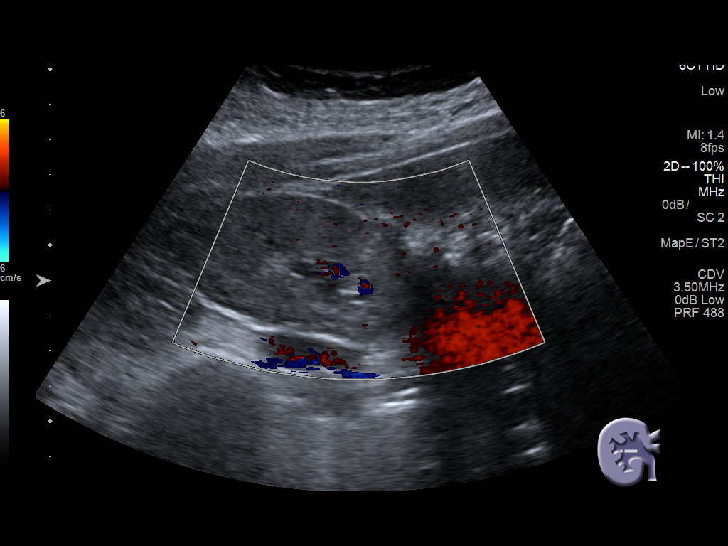
[im 19/50]
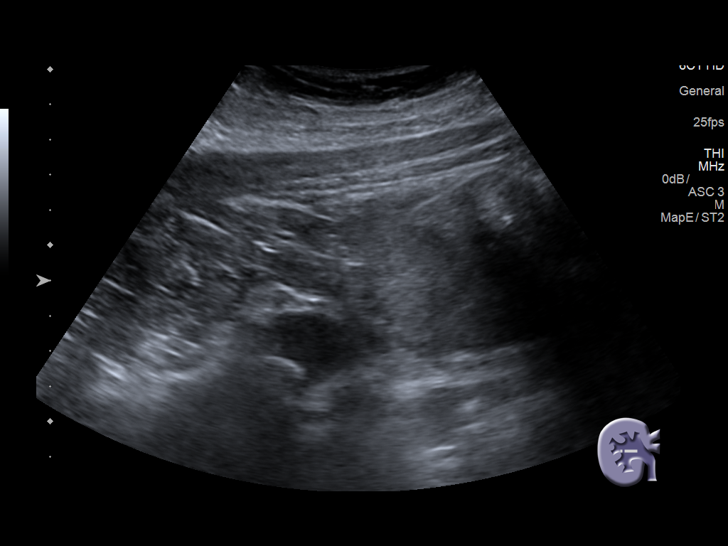
[im 23/50]
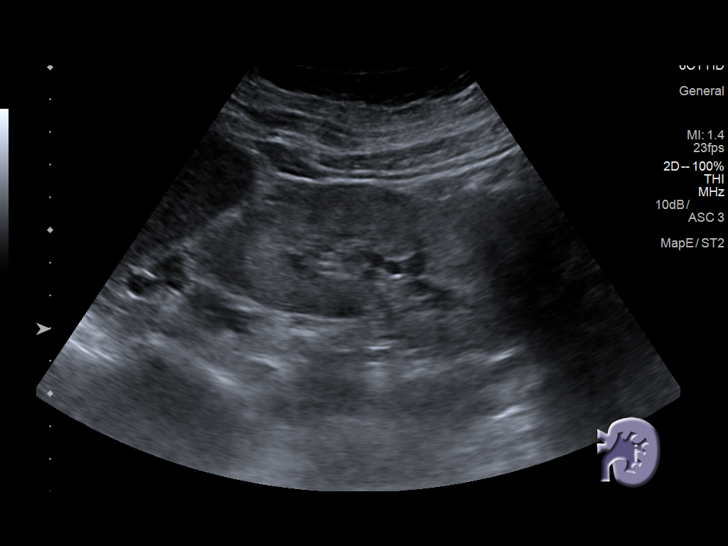
[im 27/50]
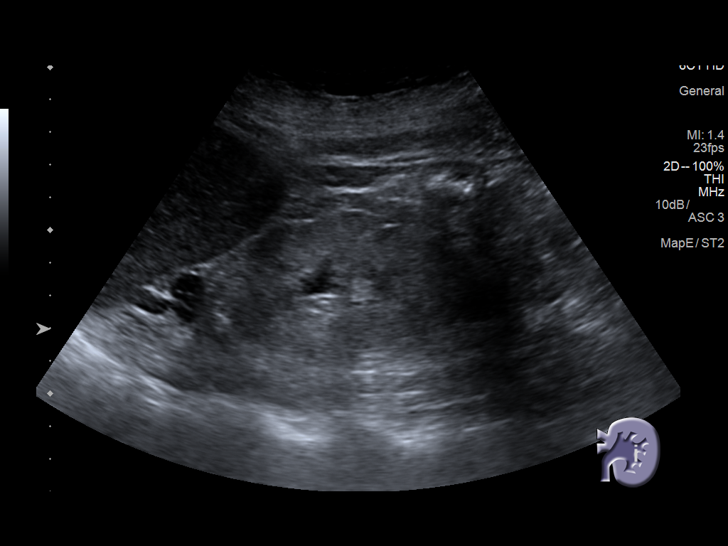
[im 31/50]
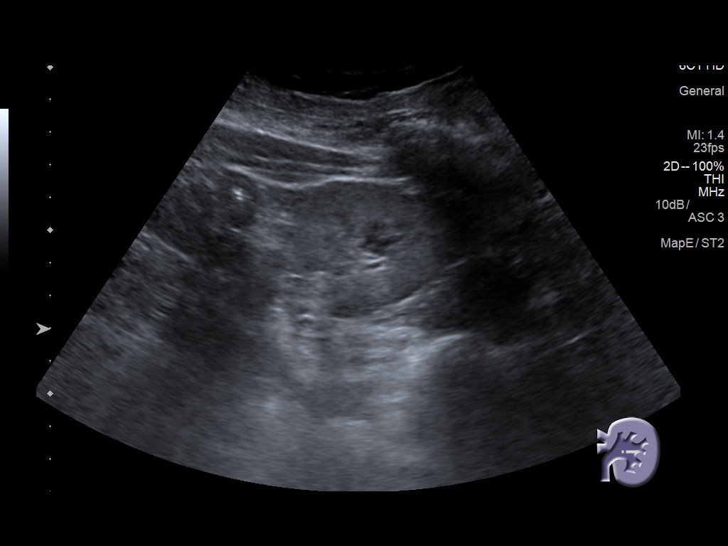
[im 33/50]
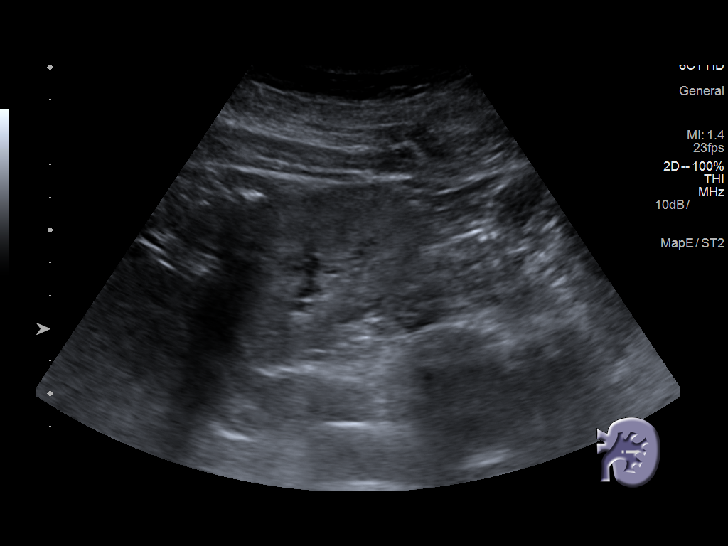
[im 37/50]
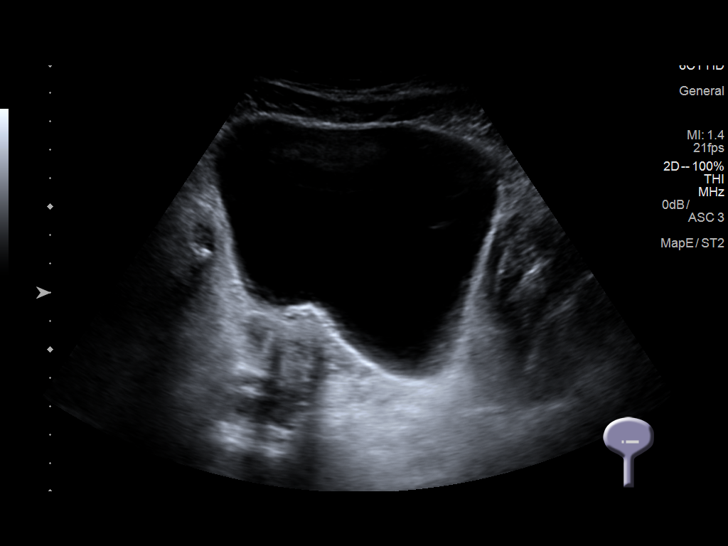
[im 41/50]
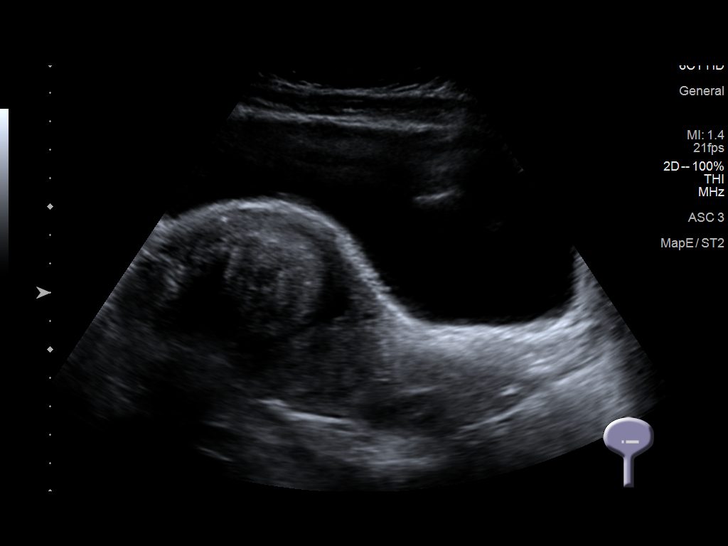
[im 45/50]
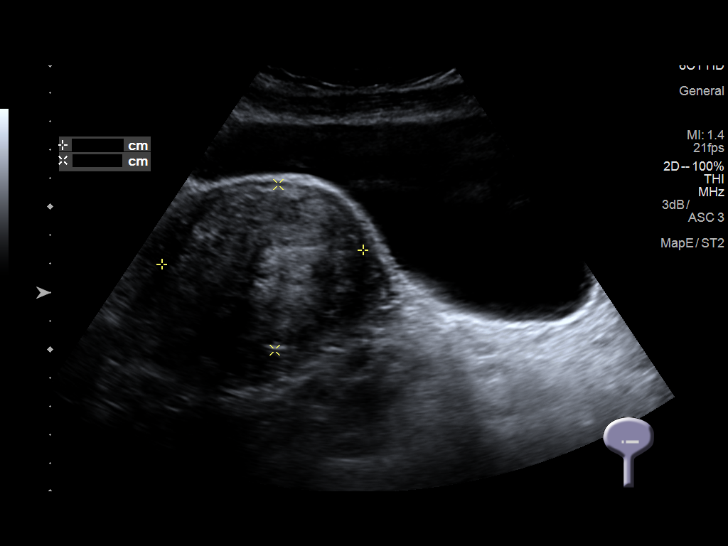
[im 50/50]
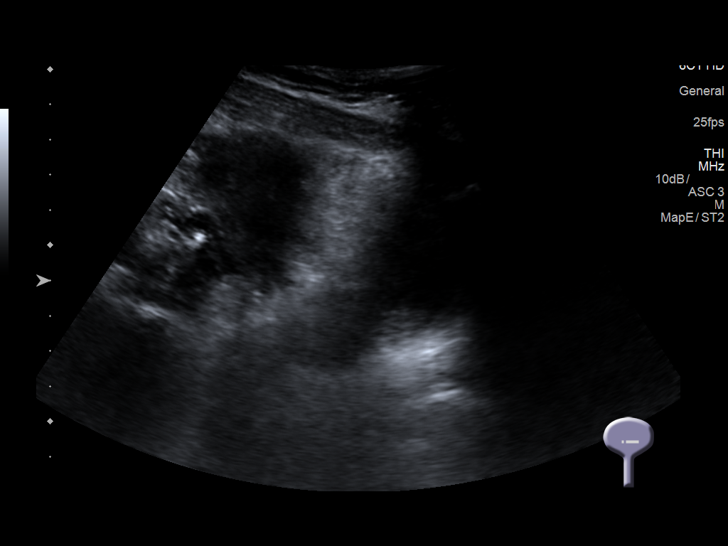

[14 of 25 positions shown; findings below may reference images not displayed]

FINDINGS: Right Kidney:

Length: 10.6 cm. Mild fullness of the collecting system is
identified. A 7 mm cyst is noted in the midportion of the right
kidney.

Left Kidney:

Length: 9.3 cm.  Mild fullness of the left renal pelvis is noted.

Bladder:

Appears normal for degree of bladder distention.

Note is made of a fibroid uterus. This may cause some mild ureteral
compression bilaterally as the fullness was noted even following
voiding.
IMPRESSION: Bilateral mild hydronephrosis which persists following voiding and
may be related to an enlarged fibroid uterus.

Tiny right renal cysts.

No other abnormality noted.
